# Patient Record
Sex: Female | Born: 1970
Health system: Southern US, Community
[De-identification: ages and names within clinical notes are randomized; demographics above are authoritative.]

## PROBLEM LIST (undated history)

## (undated) DIAGNOSIS — O1490 Unspecified pre-eclampsia, unspecified trimester: Secondary | ICD-10-CM

## (undated) DIAGNOSIS — M26609 Unspecified temporomandibular joint disorder, unspecified side: Secondary | ICD-10-CM

## (undated) DIAGNOSIS — R030 Elevated blood-pressure reading, without diagnosis of hypertension: Secondary | ICD-10-CM

## (undated) DIAGNOSIS — G43109 Migraine with aura, not intractable, without status migrainosus: Secondary | ICD-10-CM

## (undated) DIAGNOSIS — K219 Gastro-esophageal reflux disease without esophagitis: Secondary | ICD-10-CM

## (undated) HISTORY — DX: Migraine with aura, not intractable, without status migrainosus: G43.109

## (undated) HISTORY — DX: Elevated blood-pressure reading, without diagnosis of hypertension: R03.0

## (undated) HISTORY — DX: Unspecified pre-eclampsia, unspecified trimester: O14.90

## (undated) HISTORY — DX: Gastro-esophageal reflux disease without esophagitis: K21.9

## (undated) HISTORY — DX: Unspecified temporomandibular joint disorder, unspecified side: M26.609

---

## 1997-12-14 ENCOUNTER — Emergency Department (HOSPITAL_COMMUNITY): Admission: EM | Admit: 1997-12-14 | Discharge: 1997-12-14 | Payer: Self-pay | Admitting: Emergency Medicine

## 2000-04-21 ENCOUNTER — Other Ambulatory Visit: Admission: RE | Admit: 2000-04-21 | Discharge: 2000-04-21 | Payer: Self-pay | Admitting: Obstetrics and Gynecology

## 2001-07-04 ENCOUNTER — Other Ambulatory Visit: Admission: RE | Admit: 2001-07-04 | Discharge: 2001-07-04 | Payer: Self-pay | Admitting: Obstetrics and Gynecology

## 2002-07-10 ENCOUNTER — Other Ambulatory Visit: Admission: RE | Admit: 2002-07-10 | Discharge: 2002-07-10 | Payer: Self-pay | Admitting: Obstetrics and Gynecology

## 2003-06-19 ENCOUNTER — Other Ambulatory Visit: Admission: RE | Admit: 2003-06-19 | Discharge: 2003-06-19 | Payer: Self-pay | Admitting: *Deleted

## 2003-06-19 ENCOUNTER — Other Ambulatory Visit: Admission: RE | Admit: 2003-06-19 | Discharge: 2003-06-19 | Payer: Self-pay | Admitting: Obstetrics and Gynecology

## 2004-01-09 ENCOUNTER — Inpatient Hospital Stay (HOSPITAL_COMMUNITY): Admission: AD | Admit: 2004-01-09 | Discharge: 2004-01-12 | Payer: Self-pay | Admitting: Obstetrics and Gynecology

## 2004-01-13 ENCOUNTER — Encounter: Admission: RE | Admit: 2004-01-13 | Discharge: 2004-02-12 | Payer: Self-pay | Admitting: Obstetrics and Gynecology

## 2004-02-13 ENCOUNTER — Encounter: Admission: RE | Admit: 2004-02-13 | Discharge: 2004-03-14 | Payer: Self-pay | Admitting: Obstetrics and Gynecology

## 2004-03-24 ENCOUNTER — Emergency Department (HOSPITAL_COMMUNITY): Admission: EM | Admit: 2004-03-24 | Discharge: 2004-03-24 | Payer: Self-pay | Admitting: Family Medicine

## 2004-07-01 ENCOUNTER — Ambulatory Visit: Payer: Self-pay | Admitting: Family Medicine

## 2004-07-02 ENCOUNTER — Other Ambulatory Visit: Admission: RE | Admit: 2004-07-02 | Discharge: 2004-07-02 | Payer: Self-pay | Admitting: Obstetrics and Gynecology

## 2005-02-12 ENCOUNTER — Ambulatory Visit (HOSPITAL_COMMUNITY): Admission: AD | Admit: 2005-02-12 | Discharge: 2005-02-12 | Payer: Self-pay | Admitting: Obstetrics and Gynecology

## 2006-03-01 IMAGING — US US OB COMP LESS 14 WK
1 series · 14 of 28 positions shown · non-contrast
Comparison: none

CLINICAL DATA: :  10 weeks pregnant with vaginal bleeding.  
 OBSTETRICAL ULTRASOUND <14 WKS AND TRANSVAGINAL OB US:
TECHNIQUE: Both transabdominal and transvaginal ultrasound examinations were performed for complete evaluation of the gestation as well as the maternal uterus, adnexal regions, and pelvic cul-de-sac.

[Series 1: us ob comp less 14 wk · 0.33mm/px · 14 of 35 slices shown]
[im 2/35]
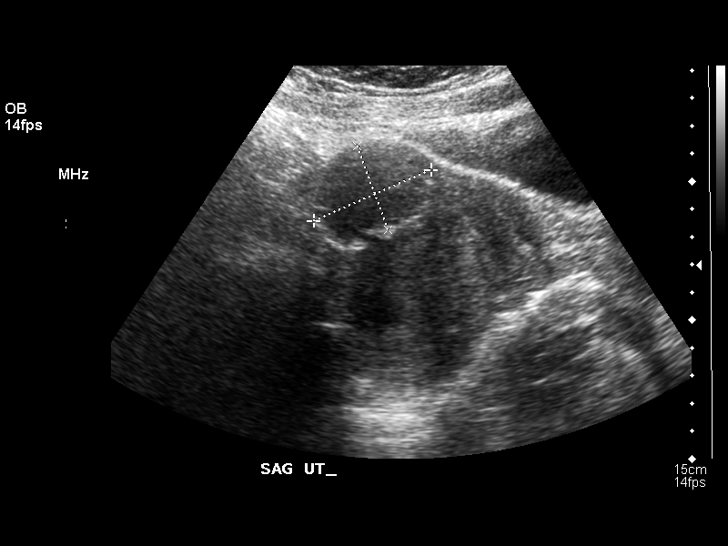
[im 4/35]
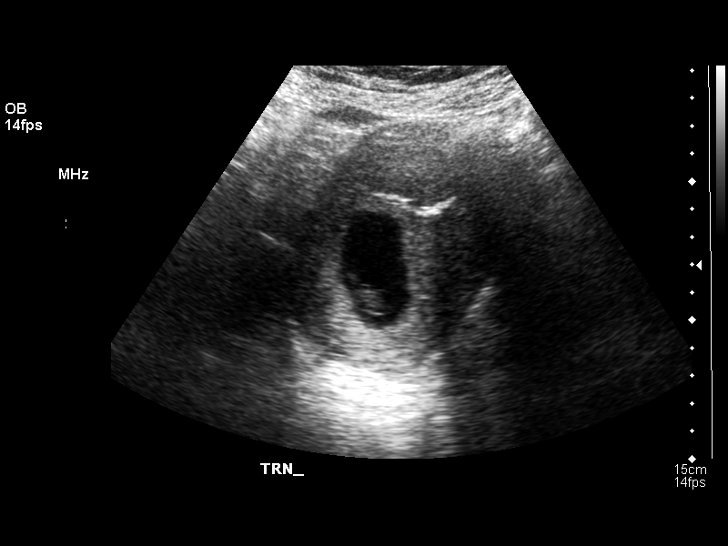
[im 7/35]
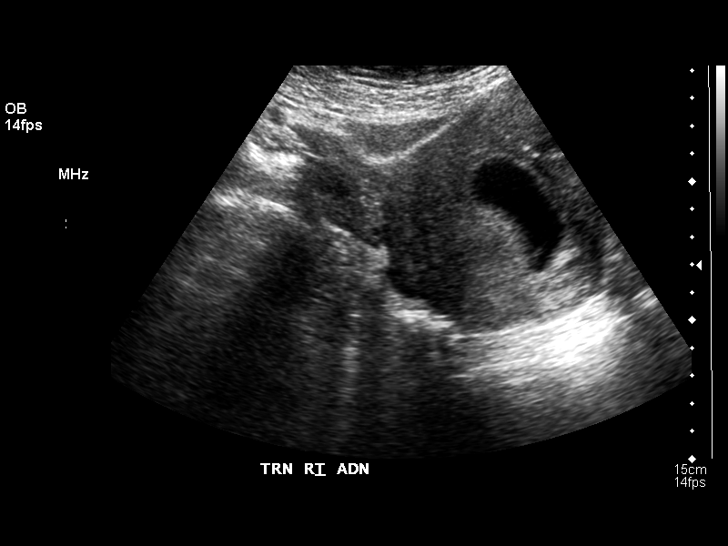
[im 9/35]
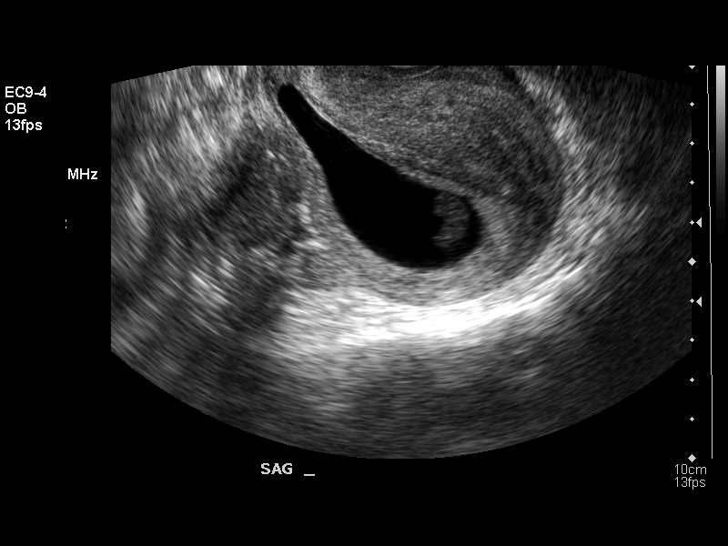
[im 12/35]
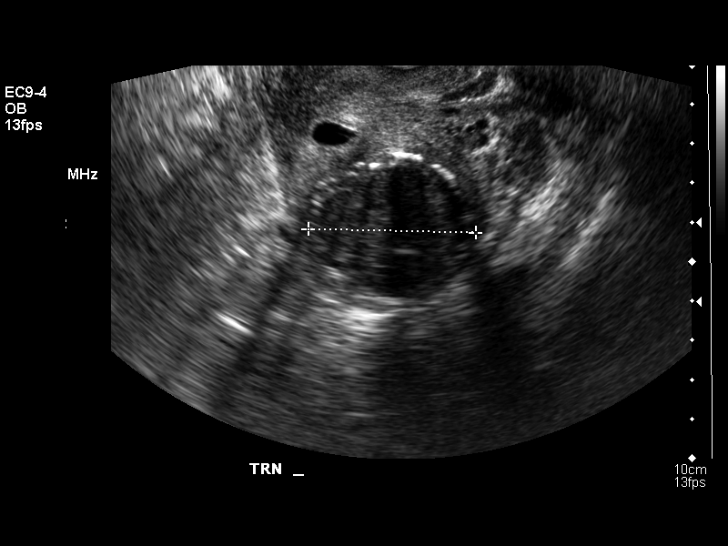
[im 14/35]
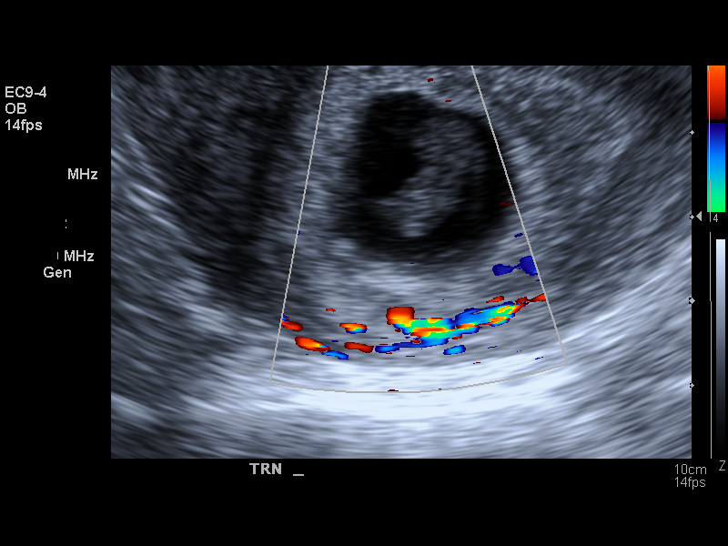
[im 17/35]
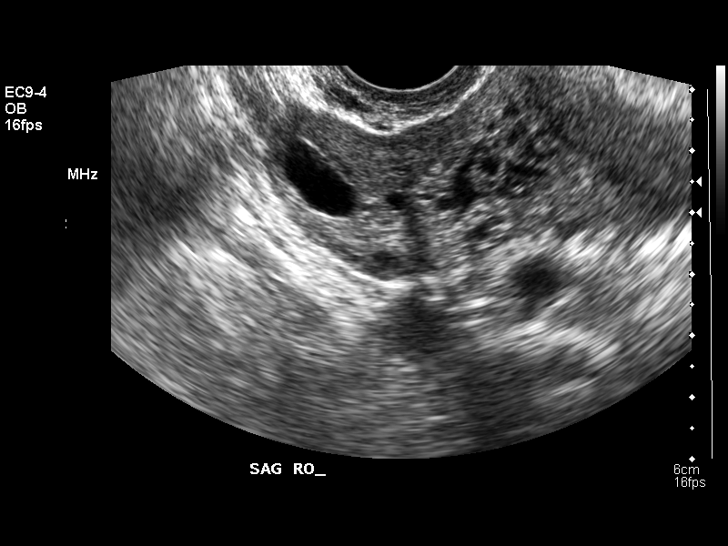
[im 19/35]
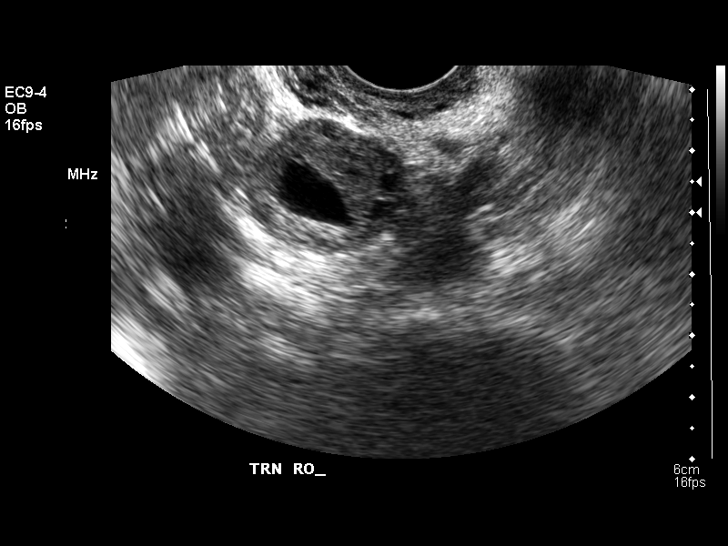
[im 22/35]
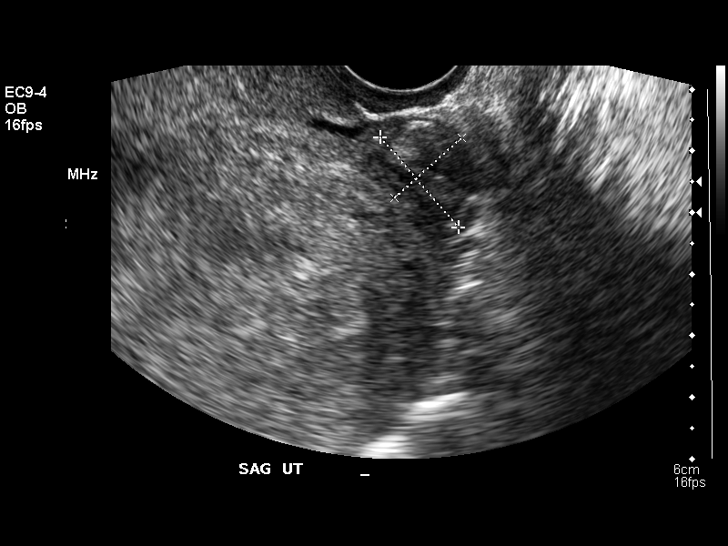
[im 24/35]
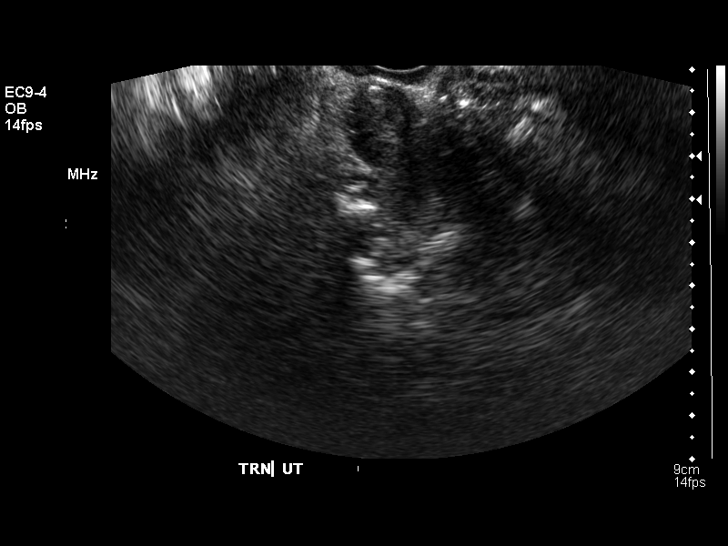
[im 27/35]
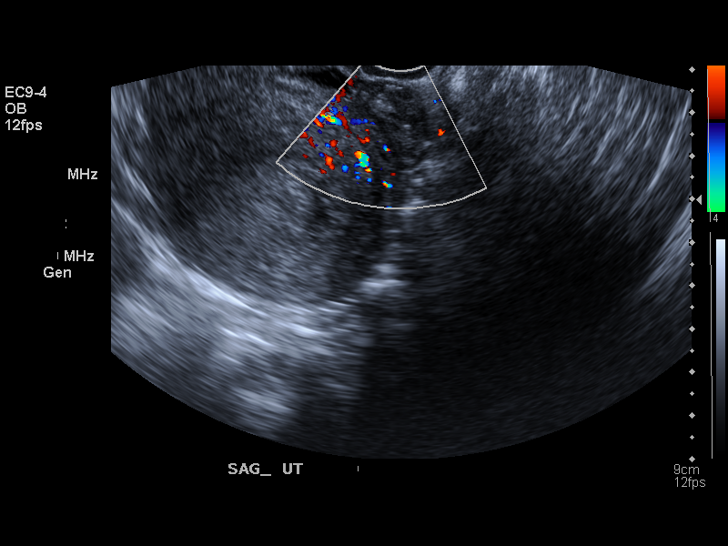
[im 29/35]
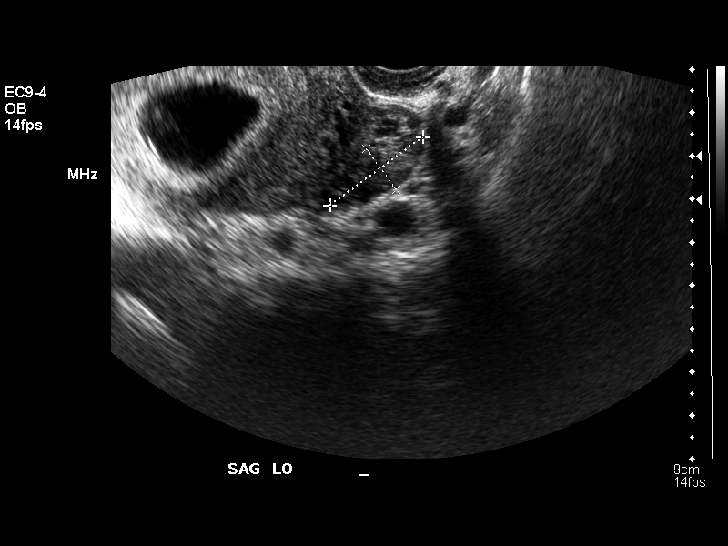
[im 32/35]
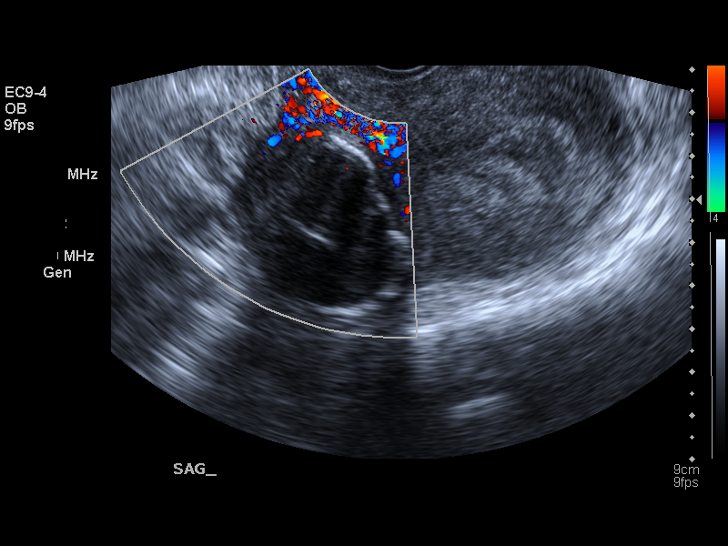
[im 35/35]
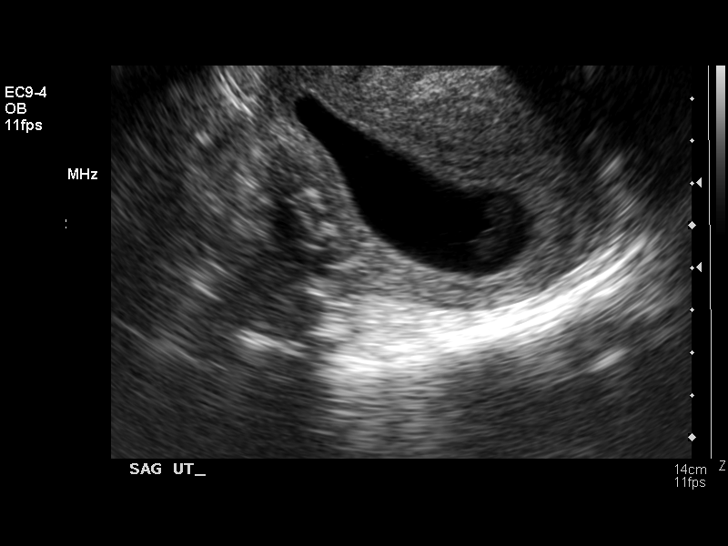

[14 of 28 positions shown; findings below may reference images not displayed]

FINDINGS: An intrauterine gestational sac is present with embryo.  By measurements of the crown rump length the estimated gestational age is 7 weeks 5 days.  However, no fetal cardiac activity is seen consistent with fetal demise.  The ovaries are normal.  Anterior uterine fibroid is noted measuring 4.4 x 3.5 x 4.3 cm with posterior fundal pedunculated fibroid of 1.9 x 1.5 x 1.8 cm.
IMPRESSION: 1.  Fetal demise.  Estimated gestational age 7 weeks 5 days.
 2.  Two uterine fibroids.

## 2006-04-20 ENCOUNTER — Inpatient Hospital Stay (HOSPITAL_COMMUNITY): Admission: AD | Admit: 2006-04-20 | Discharge: 2006-04-20 | Payer: Self-pay | Admitting: Obstetrics and Gynecology

## 2006-04-22 ENCOUNTER — Inpatient Hospital Stay (HOSPITAL_COMMUNITY): Admission: AD | Admit: 2006-04-22 | Discharge: 2006-04-22 | Payer: Self-pay | Admitting: Obstetrics and Gynecology

## 2006-04-23 ENCOUNTER — Inpatient Hospital Stay (HOSPITAL_COMMUNITY): Admission: AD | Admit: 2006-04-23 | Discharge: 2006-04-26 | Payer: Self-pay | Admitting: Obstetrics and Gynecology

## 2006-10-24 ENCOUNTER — Encounter: Payer: Self-pay | Admitting: Family Medicine

## 2006-10-24 DIAGNOSIS — K219 Gastro-esophageal reflux disease without esophagitis: Secondary | ICD-10-CM | POA: Insufficient documentation

## 2006-10-25 ENCOUNTER — Ambulatory Visit: Payer: Self-pay | Admitting: Family Medicine

## 2006-10-25 DIAGNOSIS — M26609 Unspecified temporomandibular joint disorder, unspecified side: Secondary | ICD-10-CM | POA: Insufficient documentation

## 2007-05-01 ENCOUNTER — Ambulatory Visit: Payer: Self-pay | Admitting: Family Medicine

## 2007-06-12 ENCOUNTER — Ambulatory Visit: Payer: Self-pay | Admitting: Family Medicine

## 2007-07-31 ENCOUNTER — Encounter: Payer: Self-pay | Admitting: Family Medicine

## 2007-11-22 ENCOUNTER — Ambulatory Visit: Payer: Self-pay | Admitting: Family Medicine

## 2007-12-11 ENCOUNTER — Ambulatory Visit: Payer: Self-pay | Admitting: Family Medicine

## 2008-09-27 ENCOUNTER — Emergency Department (HOSPITAL_COMMUNITY): Admission: EM | Admit: 2008-09-27 | Discharge: 2008-09-27 | Payer: Self-pay | Admitting: Emergency Medicine

## 2008-10-02 ENCOUNTER — Encounter: Payer: Self-pay | Admitting: Family Medicine

## 2008-10-03 ENCOUNTER — Ambulatory Visit: Payer: Self-pay | Admitting: Family Medicine

## 2008-10-03 DIAGNOSIS — R030 Elevated blood-pressure reading, without diagnosis of hypertension: Secondary | ICD-10-CM | POA: Insufficient documentation

## 2008-10-06 LAB — CONVERTED CEMR LAB
ALT: 17 units/L (ref 0–35)
AST: 16 units/L (ref 0–37)
Albumin: 3.5 g/dL (ref 3.5–5.2)
Alkaline Phosphatase: 46 units/L (ref 39–117)
BUN: 9 mg/dL (ref 6–23)
Basophils Absolute: 0 10*3/uL (ref 0.0–0.1)
Basophils Relative: 0.7 % (ref 0.0–3.0)
Bilirubin, Direct: 0 mg/dL (ref 0.0–0.3)
CO2: 29 meq/L (ref 19–32)
Calcium: 9 mg/dL (ref 8.4–10.5)
Chloride: 106 meq/L (ref 96–112)
Cholesterol: 179 mg/dL (ref 0–200)
Creatinine, Ser: 0.6 mg/dL (ref 0.4–1.2)
Eosinophils Absolute: 0.1 10*3/uL (ref 0.0–0.7)
Eosinophils Relative: 1.3 % (ref 0.0–5.0)
GFR calc non Af Amer: 118.41 mL/min (ref >60)
Glucose, Bld: 86 mg/dL (ref 70–99)
HCT: 38.6 % (ref 36.0–46.0)
HDL: 33.2 mg/dL — ABNORMAL LOW (ref >39.00)
Hemoglobin: 12.8 g/dL (ref 12.0–15.0)
LDL Cholesterol: 131 mg/dL — ABNORMAL HIGH (ref 0–99)
Lymphocytes Relative: 26.9 % (ref 12.0–46.0)
Lymphs Abs: 1.5 10*3/uL (ref 0.7–4.0)
MCHC: 33 g/dL (ref 30.0–36.0)
MCV: 86.6 fL (ref 78.0–100.0)
Monocytes Absolute: 0.6 10*3/uL (ref 0.1–1.0)
Monocytes Relative: 10.6 % (ref 3.0–12.0)
Neutro Abs: 3.2 10*3/uL (ref 1.4–7.7)
Neutrophils Relative %: 60.5 % (ref 43.0–77.0)
Phosphorus: 3.8 mg/dL (ref 2.3–4.6)
Platelets: 379 10*3/uL (ref 150.0–400.0)
Potassium: 4 meq/L (ref 3.5–5.1)
RBC: 4.46 M/uL (ref 3.87–5.11)
RDW: 11.6 % (ref 11.5–14.6)
Sodium: 140 meq/L (ref 135–145)
TSH: 0.79 microintl units/mL (ref 0.35–5.50)
Total Bilirubin: 0.7 mg/dL (ref 0.3–1.2)
Total CHOL/HDL Ratio: 5
Total Protein: 6.7 g/dL (ref 6.0–8.3)
Triglycerides: 73 mg/dL (ref 0.0–149.0)
VLDL: 14.6 mg/dL (ref 0.0–40.0)
WBC: 5.4 10*3/uL (ref 4.5–10.5)

## 2009-01-15 ENCOUNTER — Telehealth: Payer: Self-pay | Admitting: Family Medicine

## 2009-02-09 ENCOUNTER — Telehealth: Payer: Self-pay | Admitting: Family Medicine

## 2009-02-11 ENCOUNTER — Ambulatory Visit: Payer: Self-pay | Admitting: Family Medicine

## 2009-02-11 DIAGNOSIS — G43109 Migraine with aura, not intractable, without status migrainosus: Secondary | ICD-10-CM | POA: Insufficient documentation

## 2009-03-12 ENCOUNTER — Ambulatory Visit: Payer: Self-pay | Admitting: Family Medicine

## 2009-12-16 ENCOUNTER — Encounter
Admission: RE | Admit: 2009-12-16 | Discharge: 2010-02-09 | Payer: Self-pay | Source: Home / Self Care | Attending: Obstetrics and Gynecology | Admitting: Obstetrics and Gynecology

## 2010-02-09 NOTE — Consult Note (Signed)
Summary: [facility]/Consultation Report w/Path Report/[facility]/Consultation Report w/Path Tyna Jaksch PA-C   Imported By: Mickle Asper 08/02/2007 12:56:21  _____________________________________________________________________  External Attachment:    Type:   Image     Comment:   External Document

## 2010-02-09 NOTE — Assessment & Plan Note (Signed)
Summary: COUGH,CONGESTION,ST/CLE   Vital Signs:  Patient Profile:   40 Years Old Female Height:     67 inches (170.18 cm) Weight:      171.75 pounds (78.07 kg) Temp:     98.0 degrees F (36.67 degrees C) oral Pulse rate:   68 / minute Pulse rhythm:   regular BP sitting:   100 / 80  (left arm) Cuff size:   regular  Vitals Entered By: Silas Sacramento (December 11, 2007 10:51 AM)                 Chief Complaint:  cough/congestion.  History of Present Illness: Here due to URI--onset since 11/8--cough and resp virus, fever.  Cough never went away, now has nasal congestion too. 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  NAD Ears:     TMs retracted with increased fluid Nose:     no mucosal edema, no airflow obstruction, and mucosal erythema.  sinuses +,- Mouth:     no exudates and pharyngeal erythema.   Lungs:     freq moist harsh cough, no crackles and no wheezes.   Cervical Nodes:     shotty tonsilar nodes Psych:     normally interactive.      Impression & Recommendations:  Problem # 1:  BRONCHITIS-ACUTE (ICD-466.0) Assessment: New continue comfort care measures: increase po fluids, rest, tylenol or IBP as needed will start on zpac willl use tessalon daytine and hycodan at bedtime as needed cough see back 7-10d if not improved  Her updated medication list for this problem includes:    Zithromax Z-pak 250 Mg Tabs (Azithromycin) ..... Use as directed by mouth    Tessalon Perles 100 Mg Caps (Benzonatate) .Marland Kitchen... 1 or 2  every 8 hrs as needed cough   Complete Medication List: 1)  Protonix 20 Mg Tbec (Pantoprazole sodium) .... Take by mouth once daily as directed 2)  Ortho Micronor 0.35 Mg  Tabs (Norethindrone (contraceptive)) .... Once daily 3)  Multivitamins Tabs (Multiple vitamin) .... Once daily 4)  Zithromax Z-pak 250 Mg Tabs (Azithromycin) .... Use as directed by mouth 5)  Tessalon Perles 100 Mg Caps (Benzonatate) .Marland Kitchen.. 1 or 2  every 8 hrs as needed cough 6)  Hycodan  .Marland Kitchen.. 1 tsp at bedtime as needed cough by mouth    Prescriptions: HYCODAN 1 tsp at bedtime as needed cough by mouth  #177ml x 0   Entered and Authorized by:   Gildardo Griffes FNP   Signed by:   Gildardo Griffes FNP on 12/11/2007   Method used:   Print then Give to Patient   RxID:   [id number] TESSALON PERLES 100 MG CAPS (BENZONATATE) 1 or 2  every 8 hrs as needed cough  #30 x 1   Entered and Authorized by:   Gildardo Griffes FNP   Signed by:   Gildardo Griffes FNP on 12/11/2007   Method used:   Print then Give to Patient   RxID:   [id number] ZITHROMAX Z-PAK 250 MG TABS (AZITHROMYCIN) use as directed by mouth  #1 x 0   Entered and Authorized by:   Gildardo Griffes FNP   Signed by:   Mcarthur Rossetti  Tyrelle Raczka FNP on 12/11/2007   Method used:   Print then Give to Patient   RxID:   [id number]  ]

## 2010-02-09 NOTE — Assessment & Plan Note (Signed)
Summary: JAW PAIN/JAW LOCKS UP/HEA   Vital Signs:  Patient Profile:   40 Years Old Female Height:     67 inches (170.18 cm) Weight:      169.50 pounds Temp:     97.6 degrees F oral Pulse rate:   84 / minute Pulse rhythm:   regular BP sitting:   112 / 70  (left arm) Cuff size:   regular  Vitals Entered By: Delilah Shan (October 25, 2006 9:01 AM)                 Chief Complaint:  ? TMJ problems.  History of Present Illness: Every few years some jaw clicking with eating Over last week she has had jaw popping, painless, occ doesn't open never stuck open pain infront of ear muscle pain, across jaw and across nose takes 400 mg , ice to help  no tooth grinding  out of protonix for GERD, requests refill  Current Allergies (reviewed today): No known allergies   Past Medical History:    Reviewed history from 10/24/2006 and no changes required:       GERD   Family History:    Reviewed history from 10/24/2006 and no changes required:       Father: 2 CVA's (1st at 37 yo)  DM II       Mother: HTN       Siblings:        Grandparents:  PGM - CVA                                PGP - DM                                MGM, died BCA                                M. Aunt - [place]                                M. Uncle - Colon [place]    Review of Systems      See HPI   Physical Exam  General:     Well-developed,well-nourished,in no acute distress; alert,appropriate and cooperative throughout examination Eyes:     No corneal or conjunctival inflammation noted. EOMI. Perrla. Funduscopic exam benign, without hemorrhages, exudates or papilledema. Vision grossly normal. Ears:     External ear exam shows no significant lesions or deformities.  Otoscopic examination reveals clear canals, tympanic membranes are intact bilaterally without bulging, retraction, inflammation or discharge. Hearing is grossly normal bilaterally. Nose:     External nasal examination shows no  deformity or inflammation. Nasal mucosa are pink and moist without lesions or exudates. Mouth:     good dentition, no gingival abnormalities, no dental plaque, and pharynx pink and moist.  No dental caries, abcess Right TNJ joint tender , clicking, no locking Lungs:     Normal respiratory effort, chest expands symmetrically. Lungs are clear to auscultation, no crackles or wheezes. Heart:     Normal rate and regular rhythm. S1 and S2 normal without gallop, murmur, click, rub or other extra sounds.    Impression & Recommendations:  Problem # 1:  TEMPOROMANDIBULAR JOINT DISORDER (ICD-524.60) Info given.  Use  NSAID, ice, exercises, avoid chewing.  If muscle spasm returns call for possible muscle relaxant to use. If not improving call for appt with oral surgeon.    Problem # 2:  GERD (ICD-530.81) Refilled, but instructed for further refills needs to see primary MD who she has not seen since 2006. Her updated medication list for this problem includes:    Protonix 20 Mg Tbec (Pantoprazole sodium) ..... Every other day   Complete Medication List: 1)  Protonix 20 Mg Tbec (Pantoprazole sodium) .... Every other day 2)  Ortho Micronor 0.35 Mg Tabs (Norethindrone (contraceptive)) .... Once daily 3)  Multivitamins Tabs (Multiple vitamin) .... Once daily   Patient Instructions: 1)  if symptoms continue call for referral to oral surgeon    Prescriptions: PROTONIX 20 MG  TBEC (PANTOPRAZOLE SODIUM) every other day  #30 x 2   Entered and Authorized by:   Kerby Nora MD   Signed by:   Kerby Nora MD on 10/25/2006   Method used:   [facility] ...       [id number]       [address]       [place], [place]  [address]       Ph: [phone] or [phone]       Fax: [phone]   RxID:   [id number]  ] Current Allergies (reviewed today): No known allergies  Current Medications (including changes made in today's visit):  PROTONIX 20 MG  TBEC (PANTOPRAZOLE  SODIUM) every other day ORTHO MICRONOR 0.35 MG  TABS (NORETHINDRONE (CONTRACEPTIVE)) once daily MULTIVITAMINS   TABS (MULTIPLE VITAMIN) once daily

## 2010-02-09 NOTE — Assessment & Plan Note (Signed)
Summary: REFILL PROTONIX/CLE   Vital Signs:  Patient Profile:   40 Years Old Female Height:     67 inches (170.18 cm) Weight:      173.75 pounds Temp:     97.9 degrees F oral Pulse rate:   68 / minute BP sitting:   94 / 60  (left arm)  Vitals Entered By: Lowella Petties (June 12, 2007 1:58 PM)                 Chief Complaint:  refill protonix.  History of Present Illness: takes protonix- which works well most of the time -- takes it every other day reflux is stress related   on stressful days takes pepto too discomfort - burp and bloat and acid in throat- no abd pain  bowel habits are ok   used to take it daily for 3-4 days at a time   has never had an ulcer or bleeding has never had EGD  some family hx of colon cancer- aunt and uncle no Esoph cancer   is thinking about getting pregnant again  in past prilosec and zantac did not work nexium and protinix do   mole on L arm- is changing color     Current Allergies: No known allergies   Past Medical History:    GERD    pre eclampsia   Family History:    Reviewed history from 10/24/2006 and no changes required:       Father: 2 CVA's (1st at 81 yo)  DM II       Mother: HTN       Siblings:        Grandparents:  PGM - CVA                                PGP - DM                                MGM, died BCA                                M. Aunt - Colon [place]                                M. 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  CV      Denies chest pain or discomfort and palpitations.  Resp      Denies cough, shortness of  breath, and wheezing.  GI      Denies bloody stools, constipation, dark tarry stools, and diarrhea.  Derm      Denies changes  in color of skin and rash.  Psych      Denies anxiety and depression.      mood is good   Heme      Denies bleeding.   Physical Exam  General:     overweight but generally well appearing  Head:     normocephalic, atraumatic, and no abnormalities observed.   Eyes:     vision grossly intact, pupils equal, pupils round, and pupils reactive to light.  no conjunctival pallor, injection or icterus  Mouth:     pharynx pink and moist.   Neck:     No deformities, masses, or tenderness noted. Lungs:     Normal respiratory effort, chest expands symmetrically. Lungs are clear to auscultation, no crackles or wheezes. Heart:     Normal rate and regular rhythm. S1 and S2 normal without gallop, murmur, click, rub or other extra sounds. Abdomen:     Bowel sounds positive,abdomen soft and non-tender without masses, organomegaly or hernias noted. Extremities:     No clubbing, cyanosis, edema, or deformity noted with normal full range of motion of all joints.   Skin:     Intact without suspicious lesions or rashes Cervical Nodes:     No lymphadenopathy noted Psych:     normal affect, talkative and pleasant     Impression & Recommendations:  Problem # 1:  NEOPLASM OF UNCERTAIN BEHAVIOR OF SKIN (ICD-238.2) Assessment: New mole on L lower arm- consistent with dermatofibroma but with chanigng color  will refer to derm for further eval  Orders: Dermatology Referral (Derma)   Problem # 2:  GERD (ICD-530.81) Assessment: Unchanged ongoing- well controlled with protonix will continue to take once daily as needed- or every other day if symtpoms are not active if symptoms worsen -- would consider checking Hpylori or EGD Her updated medication list for this problem includes:    Protonix 20 Mg Tbec (Pantoprazole sodium) .Marland Kitchen... Take by mouth once daily as  directed   Complete Medication List: 1)  Protonix 20 Mg Tbec (Pantoprazole sodium) .... Take by mouth once daily as directed 2)  Ortho Micronor 0.35 Mg Tabs (Norethindrone (contraceptive)) .... Once daily 3)  Multivitamins Tabs (Multiple vitamin) .... Once daily   Patient Instructions: 1)  watch diet for acidic or spicy foods , caffine and eating late  2)  continue the protonix as you have been taking it  3)  if symptoms re occur- or become more persistant please update me   Prescriptions: PROTONIX 20 MG  TBEC (PANTOPRAZOLE SODIUM) take by mouth once daily as directed  #30 x 11   Entered and Authorized by:   Judith Part MD   Signed by:   Judith Part MD on 06/12/2007   Method used:   Print then Give to Patient   RxID:   [id number]  ]

## 2010-02-09 NOTE — Assessment & Plan Note (Signed)
Summary: BP AND VISION ISSUES   Vital Signs:  Patient profile:   40 year old female Weight:      163 pounds Temp:     98.1 degrees F oral Pulse rate:   76 / minute Pulse rhythm:   regular BP sitting:   112 / 68  (left arm) Cuff size:   regular  Vitals Entered By: Lowella Petties CMA (February 11, 2009 11:40 AM) CC: BP and vision problems.   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  eye strain is common spot in her vision started - like broken glass in both eyes -- in one spot only  lasted 20 minutes  went on lunch break -- her bp cuff did not work  so she went to [employer] down the street -- they took it 158/108 -- high for her  spot was gone then-- and just a mild headache remained   headache started as pressure over one eye and then moved fully to one side/ no throbbing  then took some ibuprofen  headache persisted - very mild 1/10 no sensitivity to sound or light - but is a Klindt nauseated yesterday   has had only one migraine in past   lots of stress for 3 weeks drinks one cup of coffee per day  not sleeping as well as usual (also sick 40 year old)  does not drink enough water   bp at work 120s-140/ 80s to 90 - high about 50% of the time  has been feeling fine   last opthy exam a while ago  lasik surgery - 10 years ago - really likes it   is on OC -- in debate over wanting another baby  Allergies: No Known Drug Allergies  Family History: Father: 2 CVA's (1st at 12 yo)  DM II Mother: HTN Siblings:  Grandparents:  PGM - CVA-- at 68                          PGP - DM                          MGM, died BCA                          M. Aunt - [place]                          M. Uncle - Colon [place] no migraines in family   Review of Systems General:  Denies fatigue, fever, loss of appetite, and malaise. Eyes:  Denies blurring and eye pain. ENT:  Denies ringing in ears, sinus  pressure, and sore throat. CV:  Denies chest pain or discomfort, palpitations, and shortness of breath with exertion. Resp:  Denies cough and wheezing. GI:  Denies abdominal pain, nausea, and vomiting. GU:  Denies dysuria and urinary frequency. MS:  Denies joint pain and joint swelling. Derm:  Denies lesion(s), poor wound healing, and rash. Neuro:  Complains of headaches; denies difficulty with concentration, disturbances in coordination, numbness, sensation of room spinning, tingling, tremors, and weakness. Psych:  Denies anxiety and depression. Endo:  Denies cold intolerance, excessive thirst, excessive urination, and heat intolerance. Heme:  Denies abnormal bruising and bleeding.  Physical Exam  General:  Well-developed,well-nourished,in no acute distress; alert,appropriate and cooperative throughout examination Head:  normocephalic, atraumatic, and no abnormalities observed.  no sinus or temporal tenderness Eyes:  vision grossly intact, pupils equal, pupils round, and pupils reactive to light.  fundi grossly wnl no conjunctival pallor, injection or icterus  Ears:  R ear normal and L ear normal.   Nose:  no nasal discharge.   Mouth:  pharynx pink and moist.   Neck:  supple with full rom and no masses or thyromegally, no JVD or carotid bruit  Chest Wall:  No deformities, masses, or tenderness noted. Lungs:  Normal respiratory effort, chest expands symmetrically. Lungs are clear to auscultation, no crackles or wheezes. Heart:  Normal rate and regular rhythm. S1 and S2 normal without gallop, murmur, click, rub or other extra sounds. Abdomen:  Bowel sounds positive,abdomen soft and non-tender without masses, organomegaly or hernias noted. Msk:  No deformity or scoliosis noted of thoracic or lumbar spine.  no acute joint changes Pulses:  R and L carotid,radial,femoral,dorsalis pedis and posterior tibial pulses are full and equal bilaterally Extremities:  No clubbing, cyanosis, edema, or  deformity noted with normal full range of motion of all joints.   Neurologic:  alert & oriented X3, cranial nerves II-XII intact, strength normal in all extremities, sensation intact to light touch, gait normal, DTRs symmetrical and normal, toes down bilaterally on Babinski, and Romberg negative.   Skin:  Intact without suspicious lesions or rashes Cervical Nodes:  No lymphadenopathy noted Psych:  normal affect, talkative and pleasant    Impression & Recommendations:  Problem # 1:  ELEVATED BLOOD PRESSURE (ICD-796.2) Assessment New mild and intermittent disc lifestyle change for HTN- avoid sodium, inc water intake and less caffiene trial of low dose atenolol for this and also headache Her updated medication list for this problem includes:    Atenolol 25 Mg Tabs (Atenolol) .Marland Kitchen... 1 by mouth once daily  Problem # 2:  MIGRAINE WITH AURA (ICD-346.00) Assessment: New will try atenolol for headache proph and also elevated bp  adv to update if side eff or problems  disc habits to avoid headaches  f/u 1 mo update if worse or other symptoms  Her updated medication list for this problem includes:    Atenolol 25 Mg Tabs (Atenolol) .Marland Kitchen... 1 by mouth once daily  Complete Medication List: 1)  Loestrin 24 Fe 1-20 Mg-mcg Tabs (Norethin ace-eth estrad-fe) .... Take one by mouth daily as directed 2)  Multivitamins Tabs (Multiple vitamin) .... Once daily 3)  Nexium 40 Mg Cpdr (Esomeprazole magnesium) .Marland Kitchen.. 1 by mouth once daily in am 4)  Atenolol 25 Mg Tabs (Atenolol) .Marland Kitchen.. 1 by mouth once daily  Patient Instructions: 1)  start atenolol 25 mg 1 pill daily  2)  keep check on blood pressure  3)  if too low (90/50 or if dizzy or any other side effects - update me) 4)  watch salt in diet  5)  avoid caffine/ drink lots of water and get regular sleep) 6)  follow up with me 1 month  7)  if you have more migraines - let me know  Prescriptions: ATENOLOL 25 MG TABS (ATENOLOL) 1 by mouth once daily  #30 x  11   Entered and Authorized by:   Judith Part MD   Signed by:   Judith Part MD on 02/11/2009   Method used:   [facility]        [facility]  [address] [phone][facility]ail)  [address]       [place], [place]  [address]       Ph: [phone] or [phone]       Fax: [phone]   RxID:   [id number]   Prior Medications (reviewed today): LOESTRIN 24 FE 1-20 MG-MCG TABS (NORETHIN ACE-ETH ESTRAD-FE) take one by mouth daily as directed MULTIVITAMINS   TABS (MULTIPLE VITAMIN) once daily NEXIUM 40 MG CPDR (ESOMEPRAZOLE MAGNESIUM) 1 by mouth once daily in am ATENOLOL 25 MG TABS (ATENOLOL) 1 by mouth once daily Current Allergies: No known allergies

## 2010-02-09 NOTE — Assessment & Plan Note (Signed)
Summary: BP elevated/ lb   Vital Signs:  Patient profile:   40 year old female Weight:      163 pounds BMI:     25.62 Temp:     97.6 degrees F oral Pulse rate:   80 / minute Pulse rhythm:   regular BP sitting:   92 / 60  (left arm) Cuff size:   regular  Vitals Entered By: Lowella Petties CMA (October 03, 2008 8:20 AM) CC: BP has been elevated   History of Present Illness: here for elevation in blood pressure was in ER on 9/18  about 3 weeks ago felt weird at work- thought bp was low it was 130/100- high for her  stayed that way for 7 hours -- kept checking it  felt like her scalp was tingly and eyes were tracking behind where they should be   then last saturday- at work - started feeling light headed and nauseated  bp was 160/110- really high  then felt really unsteady  went to the hospital and seen in ER by the time she got there-- 1-2 hours was down to 120/50  no irregular rhythm on EKGs and no palpitations or pulse changes   wt is down 8 l b  no idea what could be causing it  mother had similar problems in the past  father had cva at 61 and gm with stroke at 39  no smoking or drinking  OC is not new -- since last year  no HTN in other offices (did have pre-eclampsia with preg)   no change in diet  some salt  some minimal exercise- 20 min on elliptical at work  at work- drinks 2 c coffee once daily (12 hour shift) no soda  tea instead of coffee if not at work   no more stress than usual   protonix no longer preferred   has had intentional wt loss lately- back to pre- preg wt  happy about that no wt loss drugs or decongestants   Allergies: No Known Drug Allergies  Past History:  Past Medical History: Last updated: 06/12/2007 GERD pre eclampsia  Family History: Last updated: 10/03/2008 Father: 2 CVA's (1st at 62 yo)  DM II Mother: HTN Siblings:  Grandparents:  PGM - CVA-- at 19                          PGP - DM  MGM, died BCA                          M. Aunt - [place]                          M. Uncle - [place]  Social History: Last updated: 10/24/2006 Never Smoked Alcohol use-yes, occasionally (light) Drug use-no Marital Status: Married Children: 1, 6 months old Occupation: Works for [employer], night shift     English Degree ([place])    EMT course  Risk Factors: Smoking Status: never (10/24/2006)  Family History: Father: 2 CVA's (1st at 65 yo)  DM II Mother: HTN Siblings:  Grandparents:  PGM - CVA-- at 2                          PGP - DM  MGM, died BCA                          M. Aunt - [place]                          M. Uncle - [place]  Review of Systems General:  Denies chills, fatigue, fever, loss of appetite, and malaise. Eyes:  Denies blurring and eye pain. CV:  Denies chest pain or discomfort, palpitations, and shortness of breath with exertion. Resp:  Denies cough and wheezing. GI:  Denies abdominal pain, bloody stools, change in bowel habits, and indigestion; ? needs to change protonix prilosec does not work. GU:  Denies dysuria and urinary frequency. MS:  Denies joint pain and cramps. Derm:  Denies poor wound healing and rash. Neuro:  Denies difficulty with concentration, inability to speak, memory loss, numbness, poor balance, tingling, and weakness; very occ mild headache . Psych:  Denies anxiety, depression, panic attacks, and sense of great danger. Endo:  Denies cold intolerance, excessive thirst, excessive urination, and heat intolerance. Heme:  Denies abnormal bruising and bleeding.  Physical Exam  General:  Well-developed,well-nourished,in no acute distress; alert,appropriate and cooperative throughout examination Head:  normocephalic, atraumatic, and no abnormalities observed.  no temporal tenderness  Eyes:  vision grossly intact, pupils equal, pupils round, and pupils reactive to light.   fundi grossly wnl no nystagmus Ears:   R ear normal and L ear normal.   Nose:  no nasal discharge.   Mouth:  pharynx pink and moist.   Neck:  supple with full rom and no masses or thyromegally, no JVD or carotid bruit  Chest Wall:  No deformities, masses, or tenderness noted. Lungs:  Normal respiratory effort, chest expands symmetrically. Lungs are clear to auscultation, no crackles or wheezes. Heart:  Normal rate and regular rhythm. S1 and S2 normal without gallop, murmur, click, rub or other extra sounds. Abdomen:  Bowel sounds positive,abdomen soft and non-tender without masses, organomegaly or hernias noted. no renal bruits  Msk:  No deformity or scoliosis noted of thoracic or lumbar spine.  no acute joint changes  Pulses:  R and L carotid,radial,femoral,dorsalis pedis and posterior tibial pulses are full and equal bilaterally Extremities:  No clubbing, cyanosis, edema, or deformity noted with normal full range of motion of all joints.   Neurologic:  alert & oriented X3, cranial nerves II-XII intact, strength normal in all extremities, sensation intact to light touch, gait normal, DTRs symmetrical and normal, toes down bilaterally on Babinski, and Romberg negative.   Skin:  Intact without suspicious lesions or rashes Cervical Nodes:  No lymphadenopathy noted Inguinal Nodes:  No significant adenopathy Psych:  normal affect, talkative and pleasant - not seemingly anx or stressed at all   Impression & Recommendations:  Problem # 1:  ELEVATED BLOOD PRESSURE (ICD-796.2) Assessment New intermittent bp elevation - without particular triggers  she is on OC - but no change in that  disc lifestyle / stress/ caf intake / family hx  labs today incl lipids  adv pt to quit caffiene and also check bp daily at work- keep a log  f/u 4-6 weeks with log  adv to call in meantime if she has any more episodes Orders: Venipuncture (16109) TLB-Lipid Panel (80061-LIPID) TLB-Renal Function Panel (80069-RENAL) TLB-Hepatic/Liver Function Pnl  (80076-HEPATIC) TLB-CBC Platelet - w/Differential (85025-CBCD) TLB-TSH (Thyroid Stimulating Hormone) (84443-TSH) EKG w/ Interpretation (93000)  Complete Medication List:  1)  Protonix 20 Mg Tbec (Pantoprazole sodium) .... Take by mouth once daily as directed 2)  Loestrin 24 Fe 1-20 Mg-mcg Tabs (Norethin ace-eth estrad-fe) .... Take one by mouth daily as directed 3)  Multivitamins Tabs (Multiple vitamin) .... Once daily  Patient Instructions: 1)  gradually cut out caffine  2)  keep exercising  3)  lab today 4)  check your blood pressure on work days and keep a log  5)  follow up with me 4-6 weeks with your list of blood pressures  6)  update me if symptoms return  Prior Medications (reviewed today): PROTONIX 20 MG  TBEC (PANTOPRAZOLE SODIUM) take by mouth once daily as directed LOESTRIN 24 FE 1-20 MG-MCG TABS (NORETHIN ACE-ETH ESTRAD-FE) take one by mouth daily as directed MULTIVITAMINS   TABS (MULTIPLE VITAMIN) once daily Current Allergies: No known allergies    EKG  Procedure date:  10/03/2008  Findings:      NSR with rate of 68 and no acute changes

## 2010-02-09 NOTE — Assessment & Plan Note (Signed)
Summary: COUGH/CLE   Vital Signs:  Patient Profile:   40 Years Old Female Height:     67 inches (170.18 cm) Weight:      173 pounds Temp:     97.8 degrees F oral Pulse rate:   83 / minute BP sitting:   104 / 76  (right arm) Cuff size:   regular  Vitals Entered By: Cooper Render (May 01, 2007 11:54 AM)                 Chief Complaint:  URI sx, cough, and taking tylenol cold & sinus.  History of Present Illness: Here for URI sx--onset x 5d ago with "cold " sx, worse past 2d, moved into chest--productive of green mucus.  Taking tylenol cold and sinus--helping.  cough wakens at night.      Current Allergies (reviewed today): No known allergies      Review of Systems      See HPI   Physical Exam  General:     alert, well-developed, well-nourished, and well-hydrated.  NAD Ears:     TMs retracted with increased fluid Nose:     no airflow obstruction, mucosal erythema, and mucosal edema.   Mouth:     no exudates and pharyngeal erythema.   Lungs:     moist cough, no crackles and no wheezes.   Neurologic:     alert & oriented X3 and gait normal.   Skin:     turgor normal, color normal, and no rashes.   Psych:     normally interactive and good eye contact.      Impression & Recommendations:  Problem # 1:  URI (ICD-465.9) Assessment: New continue comfort care measures: increase po fluids, rest, tylenol or IBP as needed will use hycodan at bedtime as needed cough gave Rx for zpac--will fill if worsens over next 3-5d and will call in if fills see back in 7-10d if not improved Her updated medication list for this problem includes:    Hycodan 5-1.5 Mg/46ml Syrp (Hydrocodone-homatropine) .Marland Kitchen... 1 tsp at bedtime as needed cough, may repeat in 4-6h as needed   Complete Medication List: 1)  Protonix 20 Mg Tbec (Pantoprazole sodium) .... Every other day 2)  Ortho Micronor 0.35 Mg Tabs (Norethindrone (contraceptive)) .... Once daily 3)  Multivitamins Tabs (Multiple  vitamin) .... Once daily 4)  Hycodan 5-1.5 Mg/56ml Syrp (Hydrocodone-homatropine) .Marland Kitchen.. 1 tsp at bedtime as needed cough, may repeat in 4-6h as needed 5)  Zithromax Z-pak 250 Mg Tabs (Azithromycin) .... Use as directed     Prescriptions: ZITHROMAX Z-PAK 250 MG  TABS (AZITHROMYCIN) use as directed  #1 x 0   Entered and Authorized by:   Gildardo Griffes FNP   Signed by:   Gildardo Griffes FNP on 05/01/2007   Method used:   Print then Give to Patient   RxID:   [id number] HYCODAN 5-1.5 MG/5ML  SYRP (HYDROCODONE-HOMATROPINE) 1 tsp at bedtime as needed cough, may repeat in 4-6h as needed  #175ml x 0   Entered and Authorized by:   Gildardo Griffes FNP   Signed by:   Gildardo Griffes FNP on 05/01/2007   Method used:   Print then Give to Patient   RxID:   [id number]  ] Prior Medications (reviewed today): PROTONIX 20 MG  TBEC (PANTOPRAZOLE SODIUM) every other day ORTHO MICRONOR 0.35 MG  TABS (NORETHINDRONE (CONTRACEPTIVE)) once daily MULTIVITAMINS   TABS (MULTIPLE VITAMIN) once daily HYCODAN 5-1.5 MG/5ML  SYRP (HYDROCODONE-HOMATROPINE) 1  tsp at bedtime as needed cough, may repeat in 4-6h as needed ZITHROMAX Z-PAK 250 MG  TABS (AZITHROMYCIN) use as directed Current Allergies (reviewed today): No known allergies

## 2010-02-09 NOTE — Letter (Signed)
Summary: Out of Work  [facility]  [address] [place], [place] [address]   Phone: [phone]  Fax: [phone]    November 22, 2007   Employee:  Joann Davis    To Whom It May Concern:   For Medical reasons, please excuse the above named employee from work for the following dates:  Start:   11/22/2007  End:   11/24/2007 if she is feeling better   If you need additional information, please feel free to contact our office.         Sincerely,    Judith Part MD

## 2010-02-09 NOTE — Progress Notes (Signed)
Summary: Elevated BP and vision change  Phone Note Call from Patient Call back at [phone] cell   Caller: Patient Call For: Judith Part MD Summary of Call: Pt's BP was 150/108 at 2:50pm today. Pt had vision change, like she was looking thru a piece of glass that was broken. Effected both eyes. Lasted for 15 minutes. Now has resolved and pt can see normally. Pt has mild h/a pain level now is 2. [facility] [place] [phone]. Please advise.  Initial call taken by: Lewanda Rife LPN,  February 09, 2009 2:57 PM  Follow-up for Phone Call        needs to f/u with first availible  if severe headache after hours - seek care at ER  Follow-up by: Judith Part MD,  February 09, 2009 3:30 PM  Additional Follow-up for Phone Call Additional follow up Details #1::        Left message on pt's voice mail advising her to call for appt, seek care at ER if after hours. Additional Follow-up by: Lowella Petties CMA,  February 09, 2009 3:46 PM

## 2010-02-09 NOTE — Assessment & Plan Note (Signed)
Summary: acute/flu like symptoms/cmt   Vital Signs:  Patient Profile:   40 Years Old Female Height:     67 inches (170.18 cm) Weight:      172 pounds BMI:     27.04 Temp:     97.7 degrees F oral Pulse rate:   76 / minute Pulse rhythm:   regular BP sitting:   116 / 78  (left arm) Cuff size:   regular  Vitals Entered By: Liane Comber (November 22, 2007 11:59 AM)                 Chief Complaint:  flu like sx.  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) then monday started running a fever - chills and aches -- has been that way ever since  skin is burning on back and shoulders - but no rash  temp has been no higher than 101.5  takes tylenol for   took some old cough syrup - with hydrocodone   some mild runny nose  irritated throat- raw but not sore  had headache for several days- better now   cough is not productive  some nausea - no v/d    Current Allergies (reviewed today): No known allergies   Past Medical History:    Reviewed history from 06/12/2007 and no changes required:       GERD       pre eclampsia   Family History:    Reviewed history from 10/24/2006 and no changes required:       Father: 2 CVA's (1st at 50 yo)  DM II       Mother: HTN       Siblings:        Grandparents:  PGM - CVA                                PGP - DM                                MGM, died BCA                                M. Aunt - Colon [place]                                M. Uncle - [place]  Social History:    Reviewed history from 10/24/2006 and no changes required:       Never Smoked       Alcohol use-yes, occasionally (light)       Drug use-no       Marital Status: Married       Children: 1, 68 months old       Occupation: Works for [employer], night shift           English Degree ([place])    EMT course           Review of  Systems  General      Complains of chills, fatigue, fever, and loss of appetite.  Eyes      Denies blurring, discharge, and eye pain.  ENT  Complains of hoarseness, nasal congestion, sinus pressure, and sore throat.  CV      Denies chest pain or discomfort and palpitations.  Resp      Complains of cough and sputum productive.      Denies wheezing.  GI      Denies abdominal pain and change in bowel habits.  Derm      Denies lesion(s) and rash.   Physical Exam  General:     overweight but generally well appearing hoarse voice - and somewhat fatigued  Head:     Normocephalic and atraumatic without obvious abnormalities. no sinus tenderness  Eyes:     vision grossly intact, pupils equal, pupils round, and pupils reactive to light.   Ears:     R ear normal and L ear normal.   Nose:     nares are congested and injected  Mouth:     pharynx pink and moist, no erythema, and no exudates.   Neck:     supple with full rom and no masses or thyromegally, no JVD or carotid bruit  Chest Wall:     No deformities, masses, or tenderness noted. Lungs:     harsh bs at bases with no crackles/rales or wheeze  Heart:     Normal rate and regular rhythm. S1 and S2 normal without gallop, murmur, click, rub or other extra sounds. Skin:     Intact without suspicious lesions or rashes Cervical Nodes:     No lymphadenopathy noted Psych:     normal affect, talkative and pleasant     Impression & Recommendations:  Problem # 1:  URI (ICD-465.9) Assessment: New viral with cough and improving fever sympt care and rest discussed- tylenol, fluids, hycodan for cough update if not imp in 1 week off work tom update if cough becomes productive  Complete Medication List: 1)  Protonix 20 Mg Tbec (Pantoprazole sodium) .... Take by mouth once daily as directed 2)  Ortho Micronor 0.35 Mg Tabs (Norethindrone (contraceptive)) .... Once daily 3)  Multivitamins Tabs (Multiple vitamin) .... Once  daily 4)  Hycodan Syrup  .Marland Kitchen.. 1 teaspoon up to every 6 hours as needed cough   Patient Instructions: 1)  try to get lots of rest  2)  use hycodan for cough with caution- can make you sleepy  3)  tylenol for fever  4)  update me if fever goes up or cough becomes productive or if you do not improve in next week  5)  stay home tommorrow - and rest    Prescriptions: HYCODAN SYRUP 1 teaspoon up to every 6 hours as needed cough  #120cc x 0   Entered and Authorized by:   Judith Part MD   Signed by:   Judith Part MD on 11/22/2007   Method used:   Print then Give to Patient   RxID:   [id number]  ]

## 2010-02-09 NOTE — Assessment & Plan Note (Signed)
Summary: ONE MONTH FOLLOW UP / LFW   Vital Signs:  Patient profile:   40 year old female Height:      66.75 inches Weight:      163.50 pounds BMI:     25.89 Temp:     97.6 degrees F oral Pulse rate:   72 / minute Pulse rhythm:   regular BP sitting:   94 / 60  (left arm) Cuff size:   regular  Vitals Entered By: Lewanda Rife LPN (March 12, 1608 9:08 AM)  History of Present Illness: here for f/u of headache/ migraine and high bp readings  no more incidents- with migraines  no headaches at all   no trouble with beta blocker  bp is excellent  few times was in one teens /80s   no problems   is trying to be good with diet  had to take a flexeril last night -- 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 Aunt - Colon [place]                          M. Uncle - Colon [place] no migraines in family   Social History: Last updated: 10/24/2006 Never Smoked Alcohol use-yes, occasionally (light) Drug use-no Marital Status: Married Children: 1, 6 months old Occupation: Works for [employer], night shift     English Degree ([place])    EMT course  Risk Factors: Smoking Status: never (10/24/2006)  Review of Systems General:  Denies chills, fatigue, fever, and loss of appetite. Eyes:  Denies blurring, double vision, eye irritation, eye pain, vision loss-1 eye, and vision loss-both eyes. ENT:  Denies sinus pressure and sore throat. CV:  Denies chest pain or discomfort, palpitations, and shortness of breath with exertion. Resp:  Denies cough and wheezing. GI:  Denies abdominal pain, bloody stools, change in  bowel habits, and nausea. MS:  Denies joint pain and joint swelling. Derm:  Denies lesion(s), poor wound healing, and rash. Neuro:  Denies headaches, numbness, tingling, visual disturbances, and weakness. Psych:  Denies anxiety and depression. Endo:  Denies excessive thirst and excessive urination.  Physical Exam  General:  Well-developed,well-nourished,in no acute distress; alert,appropriate and cooperative throughout examination Head:  normocephalic, atraumatic, and no abnormalities observed.   Eyes:  vision grossly intact, pupils equal, pupils round, and pupils reactive to light.   fundi grossly wnl  Mouth:  pharynx pink and moist.   Neck:  supple with full rom and no masses or thyromegally, no JVD or carotid bruit  Chest Wall:  No deformities, masses, or tenderness noted. Lungs:  Normal respiratory effort, chest expands symmetrically. Lungs are clear to auscultation, no crackles or wheezes. Heart:  Normal  rate and regular rhythm. S1 and S2 normal without gallop, murmur, click, rub or other extra sounds. Pulses:  R and L carotid,radial,femoral,dorsalis pedis and posterior tibial pulses are full and equal bilaterally Extremities:  No clubbing, cyanosis, edema, or deformity noted with normal full range of motion of all joints.   Neurologic:  cranial nerves II-XII intact, sensation intact to light touch, gait normal, and DTRs symmetrical and normal.   Skin:  Intact without suspicious lesions or rashes Cervical Nodes:  No lymphadenopathy noted Inguinal Nodes:  No significant adenopathy Psych:  normal affect, talkative and pleasant    Impression & Recommendations:  Problem # 1:  MIGRAINE WITH AURA (ICD-346.00) Assessment Improved resolved with low dose atenolol and lifestyle change will continue that - and update if they return Her updated medication list for this problem includes:    Atenolol 25 Mg Tabs (Atenolol) .Marland Kitchen... 1 by mouth once daily    Mobic 7.5 Mg Tabs (Meloxicam) .Marland Kitchen...  Take one tablet as needed  Problem # 2:  ELEVATED BLOOD PRESSURE (ICD-796.2) Assessment: Improved  resolved with low dose atenolol -- will continue that and watch for s/s hypotension or low pulse  she will continue to monitor at work  Her updated medication list for this problem includes:    Atenolol 25 Mg Tabs (Atenolol) .Marland Kitchen... 1 by mouth once daily  BP today: 94/60 Prior BP: 112/68 (02/11/2009)  Labs Reviewed: Creat: 0.6 (10/03/2008) Chol: 179 (10/03/2008)   HDL: 33.20 (10/03/2008)   LDL: 131 (10/03/2008)   TG: 73.0 (10/03/2008)  Instructed in low sodium diet (DASH Handout) and behavior modification.    Complete Medication List: 1)  Loestrin 24 Fe 1-20 Mg-mcg Tabs (Norethin ace-eth estrad-fe) .... Take one by mouth daily as directed 2)  Multivitamins Tabs (Multiple vitamin) .... Once daily 3)  Nexium 40 Mg Cpdr (Esomeprazole magnesium) .Marland Kitchen.. 1 by mouth once daily in am 4)  Atenolol 25 Mg Tabs (Atenolol) .Marland Kitchen.. 1 by mouth once daily 5)  Flexeril 10 Mg Tabs (Cyclobenzaprine hcl) .... Take one tablet as needed 6)  Mobic 7.5 Mg Tabs (Meloxicam) .... Take one tablet as needed  Patient Instructions: 1)  no change in medicine  2)  let me know if headaches return or any other problems  3)  check bp intermittently   Current Allergies (reviewed today): No known allergies

## 2010-02-09 NOTE — Progress Notes (Signed)
Summary: Nexium  Phone Note Call from Patient Call back at Home Phone [phone]   Caller: Patient Call For: Judith Part MD Summary of Call: pt checked with her insurance and would like a 90day rx for Nexium which is covered thru her insurance, [facility]. Initial call taken by: Mervin Hack CMA Duncan Dull),  January 15, 2009 4:00 PM  Follow-up for Phone Call        px written on EMR for call in for nexium  Follow-up by: Judith Part MD,  January 15, 2009 4:08 PM  Additional Follow-up for Phone Call Additional follow up Details #1::        [facility] [address]. Additional Follow-up by: Lowella Petties CMA,  January 16, 2009 9:46 AM    New/Updated Medications: NEXIUM 40 MG CPDR (ESOMEPRAZOLE MAGNESIUM) 1 by mouth once daily in am Prescriptions: NEXIUM 40 MG CPDR (ESOMEPRAZOLE MAGNESIUM) 1 by mouth once daily in am  #90 x 3   Entered and Authorized by:   Judith Part MD   Signed by:   Lowella Petties CMA on 01/16/2009   Method used:   Telephoned to ...         RxID:   [id number]

## 2010-02-17 ENCOUNTER — Inpatient Hospital Stay (HOSPITAL_COMMUNITY)
Admission: AD | Admit: 2010-02-17 | Discharge: 2010-02-19 | DRG: 774 | Disposition: A | Discharge status: Home / Self Care | Payer: 59 | Source: Ambulatory Visit | Attending: Obstetrics and Gynecology | Admitting: Obstetrics and Gynecology

## 2010-02-17 ENCOUNTER — Other Ambulatory Visit: Payer: Self-pay | Admitting: Obstetrics and Gynecology

## 2010-02-17 DIAGNOSIS — O99814 Abnormal glucose complicating childbirth: Secondary | ICD-10-CM | POA: Diagnosis present

## 2010-02-17 DIAGNOSIS — O09529 Supervision of elderly multigravida, unspecified trimester: Secondary | ICD-10-CM | POA: Diagnosis present

## 2010-02-17 DIAGNOSIS — O1002 Pre-existing essential hypertension complicating childbirth: Principal | ICD-10-CM | POA: Diagnosis present

## 2010-02-17 LAB — CBC
HCT: 44.4 % (ref 36.0–46.0)
Hemoglobin: 15.2 g/dL — ABNORMAL HIGH (ref 12.0–15.0)
MCH: 30.5 pg (ref 26.0–34.0)
MCHC: 34.2 g/dL (ref 30.0–36.0)
MCV: 89.2 fL (ref 78.0–100.0)
Platelets: 248 10*3/uL (ref 150–400)
RBC: 4.98 MIL/uL (ref 3.87–5.11)
RDW: 13 % (ref 11.5–15.5)
WBC: 9.4 10*3/uL (ref 4.0–10.5)

## 2010-02-17 LAB — COMPREHENSIVE METABOLIC PANEL
ALT: 11 U/L (ref 0–35)
AST: 20 U/L (ref 0–37)
Albumin: 2.5 g/dL — ABNORMAL LOW (ref 3.5–5.2)
Alkaline Phosphatase: 148 U/L — ABNORMAL HIGH (ref 39–117)
BUN: 7 mg/dL (ref 6–23)
CO2: 19 mEq/L (ref 19–32)
Calcium: 8.5 mg/dL (ref 8.4–10.5)
Chloride: 109 mEq/L (ref 96–112)
Creatinine, Ser: 0.37 mg/dL — ABNORMAL LOW (ref 0.4–1.2)
GFR calc Af Amer: >60 mL/min (ref >60)
GFR calc non Af Amer: >60 mL/min (ref >60)
Glucose, Bld: 92 mg/dL (ref 70–99)
Potassium: 3.8 mEq/L (ref 3.5–5.1)
Sodium: 136 mEq/L (ref 135–145)
Total Bilirubin: 0.5 mg/dL (ref 0.3–1.2)
Total Protein: 5.4 g/dL — ABNORMAL LOW (ref 6.0–8.3)

## 2010-02-17 LAB — RPR: RPR Ser Ql: NONREACTIVE

## 2010-02-17 LAB — LACTATE DEHYDROGENASE: LDH: 117 U/L (ref 94–250)

## 2010-02-17 LAB — URINALYSIS, DIPSTICK ONLY
Bilirubin Urine: NEGATIVE
Hgb urine dipstick: NEGATIVE
Ketones, ur: NEGATIVE mg/dL
Leukocytes, UA: NEGATIVE
Nitrite: NEGATIVE
Protein, ur: NEGATIVE mg/dL
Specific Gravity, Urine: 1.02 (ref 1.005–1.030)
Urine Glucose, Fasting: NEGATIVE mg/dL
Urobilinogen, UA: 0.2 mg/dL (ref 0.0–1.0)
pH: 6 (ref 5.0–8.0)

## 2010-02-17 LAB — URIC ACID: Uric Acid, Serum: 4.2 mg/dL (ref 2.4–7.0)

## 2010-02-18 LAB — CBC
HCT: 36.7 % (ref 36.0–46.0)
Hemoglobin: 12.2 g/dL (ref 12.0–15.0)
MCH: 30 pg (ref 26.0–34.0)
MCHC: 33.2 g/dL (ref 30.0–36.0)
MCV: 90.4 fL (ref 78.0–100.0)
Platelets: 210 10*3/uL (ref 150–400)
RBC: 4.06 MIL/uL (ref 3.87–5.11)
RDW: 13.3 % (ref 11.5–15.5)
WBC: 11.1 10*3/uL — ABNORMAL HIGH (ref 4.0–10.5)

## 2010-02-18 LAB — GLUCOSE, CAPILLARY: Glucose-Capillary: 83 mg/dL (ref 70–99)

## 2010-02-19 LAB — GLUCOSE, CAPILLARY: Glucose-Capillary: 93 mg/dL (ref 70–99)

## 2010-03-01 ENCOUNTER — Encounter: Payer: Self-pay | Admitting: Family Medicine

## 2010-03-01 ENCOUNTER — Ambulatory Visit (INDEPENDENT_AMBULATORY_CARE_PROVIDER_SITE_OTHER): Payer: 59 | Admitting: Family Medicine

## 2010-03-01 DIAGNOSIS — J069 Acute upper respiratory infection, unspecified: Secondary | ICD-10-CM | POA: Insufficient documentation

## 2010-03-09 NOTE — Assessment & Plan Note (Signed)
Summary: ST/CLE   [facility]   Vital Signs:  Patient profile:   40 year old female Weight:      179.25 pounds BMI:     28.39 Temp:     98.0 degrees F oral Pulse rate:   72 / minute Pulse rhythm:   regular BP sitting:   128 / 80  (left arm) Cuff size:   regular  Vitals Entered By: Sydell Axon LPN (March 01, 2010 9:32 AM) CC: Productive cough-green/yellow and sore throat   History of Present Illness: saturday started loosing voice and then sore throat  then the prod cough - some yellow/ green last night started with congestion   no fever   throat was bad last night - a [place] better this am - then worse again this is worst symptom no strep in the house   both kids have coughs mom had laryngitis   had baby 12 days ago and is nursing - not getting any sleep  Allergies: No Known Drug Allergies  Past History:  Past Medical History: Last updated: 06/12/2007 GERD pre eclampsia  Family History: Last updated: 02/11/2009 Father: 2 CVA's (1st at 16 yo)  DM II Mother: HTN Siblings:  Grandparents:  PGM - CVA-- at 76                          PGP - DM                          MGM, died BCA                          M. Aunt - Colon CA                          M. Uncle - Colon CA no migraines in family   Social History: Last updated: 10/24/2006 Never Smoked Alcohol use-yes, occasionally (light) Drug use-no Marital Status: Married Children: 1, 6 months old Occupation: Works for [employer], night shift     English Degree ([place])    EMT course  Risk Factors: Smoking Status: never (10/24/2006)  Review of Systems General:  Complains of malaise; denies loss of appetite. Eyes:  Denies blurring, discharge, and eye irritation. ENT:  Complains of nasal congestion, postnasal drainage, sinus pressure, and sore throat; denies earache. CV:  Denies chest pain or discomfort, palpitations, and shortness of breath with exertion. Resp:  Complains of cough; denies shortness of breath  and wheezing. GI:  Denies abdominal pain, change in bowel habits, nausea, and vomiting. Derm:  Denies rash.  Physical Exam  General:  fatigued appearing / congested female cries when talking about symptoms and home situation Head:  normocephalic, atraumatic, and no abnormalities observed.  no sinus tenderness Eyes:  vision grossly intact, pupils equal, pupils round, pupils reactive to light, and no injection.   Ears:  R ear normal and L ear normal.   Nose:  nares are injected and congested bilaterally  Mouth:  post throat injection , no exudate no swelling  Neck:  supple with full rom and no masses or thyromegally, no JVD or carotid bruit  Lungs:  Normal respiratory effort, chest expands symmetrically. Lungs are clear to auscultation, no crackles or wheezes. Heart:  Normal rate and regular rhythm. S1 and S2 normal without gallop, murmur, click, rub or other extra sounds. Skin:  Intact without suspicious  lesions or rashes Cervical Nodes:  No lymphadenopathy noted Psych:  pt quite tearful when talking about illness in light of lack of sleep with new baby   Impression & Recommendations:  Problem # 1:  VIRAL URI (ICD-465.9) Assessment New  with sore throat that is becoming more severe and some sinus pain  will tx with amox (decided this after a call back later in day when pt stated her sore throat was much, much worse) claritin ok for rhinorrhea and plain guifenesin for congestion - as directed  pt advised to update me if symptoms worsen or do not improve  Her updated medication list for this problem includes:    Mobic 7.5 Mg Tabs (Meloxicam) .Marland Kitchen... Take one tablet as needed  Orders: Prescription Created Electronically [phone])  Complete Medication List: 1)  Multivitamins Tabs (Multiple vitamin) .... Once daily 2)  Nexium 40 Mg Cpdr (Esomeprazole magnesium) .Marland Kitchen.. 1 by mouth once daily in am 3)  Atenolol 25 Mg Tabs (Atenolol) .Marland Kitchen.. 1 by mouth once daily 4)  Flexeril 10 Mg Tabs  (Cyclobenzaprine hcl) .... Take one tablet as needed 5)  Mobic 7.5 Mg Tabs (Meloxicam) .... Take one tablet as needed 6)  Amoxicillin 250 Mg/75ml Susr (Amoxicillin) .... 2 teaspoons by mouth three times a day for 10 days  Patient Instructions: 1)  tylenol / rest as you can 2)  lots of fluids  3)  I will update you when I get more info on symptomatic care  4)  update me asap if fever or increased productive cough or worse in any way Prescriptions: AMOXICILLIN 250 MG/5ML SUSR (AMOXICILLIN) 2 teaspoons by mouth three times a day for 10 days  #10 days x 0   Entered and Authorized by:   Judith Part MD   Signed by:   Judith Part MD on 03/01/2010   Method used:   Electronically to        [facility]  [address] (retail)       [address]       [place], [place]  [address]       Ph: [phone] or [phone]       Fax: [phone]   RxID:   [id number] NEXIUM 40 MG CPDR (ESOMEPRAZOLE MAGNESIUM) 1 by mouth once daily in am  #90 x 3   Entered and Authorized by:   Judith Part MD   Signed by:   Judith Part MD on 03/01/2010   Method used:   Print then Give to Patient   RxID:   [id number]    Orders Added: 1)  Prescription Created Electronically [G8553] 2)  Est. Patient Level III [95188]    Current Allergies (reviewed today): No known allergies

## 2010-05-28 NOTE — H&P (Signed)
NAMESYMPHANI, Joann Davis                ACCOUNT NO.:  [id number]   MEDICAL RECORD NO.:  [id number]          PATIENT TYPE:  INP   LOCATION:  9164                          [facility]ILITY:  [facility]   PHYSICIAN:  Naima A. Dillard, M.D. DATE OF BIRTH:  08-Mar-1970   DATE OF ADMISSION:  01/09/2004  DATE OF DISCHARGE:                                HISTORY & PHYSICAL   HISTORY OF PRESENT ILLNESS:  This is a 40 year old gravida 1, para 0 at 21-  4/7 weeks who presents for evaluation of labor.  She reports leaking fluid  since about 4 a.m.  She was seen this morning and had a negative exam for  rupture, but was mildly contracting and cervix was 2- to 3-cm dilated.  She  came back later this afternoon and had a positive exam for rupture with  cervix of 3-4 cm, 95% effaced and -1 station with a vertex presentation,  positive Nitrazine and positive pooling.  She is therefore being admitted  for labor.  Pregnancy has been followed by the nurse midwife service and  remarkable for:  #1 - Unsure LMP, #2 - GERD, #3 - fibroids.   HISTORY OF CURRENT PREGNANCY:  The patient entered care at 6 weeks.  She had  an early ultrasound which confirmed dates.  Labs were normal.  Quad screen  was normal.  She had an ultrasound at 18 weeks which was within normal  limits and remarkable only for an anterior lower uterine segment fibroid at  2 cm away from the os.  She had some bronchitis in mid-pregnancy.  Glucola  was normal.  She did get a flu shot at work and her group B strep was  negative.   OBSTETRICAL HISTORY:  The patient is a primigravida.   MEDICAL HISTORY:  Medical history is remarkable for:  1.  Uterine fibroid.  2.  Childhood varicella.  3.  Hepatitis vaccine.  4.  History of anemia which she reports as being 2 episodes in the last 10      years.  5.  History of GERD for which she uses Zantac.   SURGICAL HISTORY:  Her surgical history is remarkable for:  1.  A broken arm at age 46.  2.  Wisdom teeth.  3.   LASIK surgery.   FAMILY HISTORY:  Family history is remarkable for a grandfather with MI,  mother with hypertension, father with diet-controlled diabetes, cousin with  seizures, grandmother with breast cancer and aunt and uncle with colon  cancers, father with a stroke, grandmother with stroke.   GENETIC HISTORY:  Genetic history is remarkable for patient's husband's  second cousin with Down's syndrome and a second cousin who is deaf, and a  first cousin who died of leukemia.   SOCIAL HISTORY:  The patient is married to  [employer], who is involved and  supportive.  She works in Doctor, hospital.  She is of the [employer].  She denies any alcohol, tobacco or drug use.   PRENATAL LABORATORIES:  Hemoglobin 13.1 and platelets 361,000.  Blood type O-  positive, antibody screen negative.  RPR  nonreactive.  Rubella immune.  Hepatitis negative.  HIV negative.  Pap test normal.  Gonorrhea negative.  Chlamydia negative.  Toxoplasmosis negative.  Group B strep negative.   OBJECTIVE DATA:  VITAL SIGNS:  Vital signs stable, afebrile.  HEENT:  Within normal limits.  NECK:  Thyroid normal, not enlarged.  CHEST:  Chest clear to auscultation.  HEART:  Heart rate regular rate and rhythm.  ABDOMEN:  Abdomen gravid, 38 cm, vertex to Leopold's.  Fetal heart rate 150.  PELVIC:  Cervical exam 3-4 cm, 95% effaced, -1 station with a vertex  presentation, positive Nitrazine, positive pooling, clear fluid.  EXTREMITIES:  Extremities within normal limits.   ASSESSMENT:  1.  Intrauterine pregnancy at 39-4/7 weeks.  2.  Early active labor.  3.  Spontaneous rupture of membranes.  4.  Group B streptococcus negative.   PLAN:  1.  Admit to birthing suite, Renaldo Reel. Emilee Hero, C.N.M. notified.  Dr. Pierre Bali.      Dillard will be notified.  2.  Routine C.N.M. orders.     Larey Brick   MLW/MEDQ  D:  01/09/2004  T:  01/09/2004  Job:  [id number]

## 2010-05-28 NOTE — H&P (Signed)
NAMEJA, PISTOLE                ACCOUNT NO.:  [id number]   MEDICAL RECORD NO.:  [id number]          PATIENT TYPE:  INP   LOCATION:  9162                          [facility]   PHYSICIAN:  Naima A. Dillard, M.D. DATE OF BIRTH:  Aug 26, 1970   DATE OF ADMISSION:  04/23/2006  DATE OF DISCHARGE:                              HISTORY & PHYSICAL   HISTORY OF PRESENT ILLNESS:  Ms. Joann Davis is a 40 year old gravida 3, para  1-0-1-1, who presents at 36-4/7 weeks, EDD May 16, 2006.  She presents  for induction of labor secondary to preeclampsia.  She returned a 24-  hour urine collection on April 22, 2006 with results showing total  protein of 325 mg per day.  She has been seen in maternity admissions on  Thursday, April 20, 2006 and April 22, 2006 for elevated blood  pressures.  She does not report any PIH symptoms, no headache, visual  changes or epigastric pain.  She does report positive fetal movement,  irregular contractions.  No abdominal cramping or pain.  No vaginal  bleeding, no rupture of membranes and no nausea or vomiting.  Her  pregnancy has been followed by the C.N.M. service at [facility] and is  remarkable for:  1. Advanced maternal age.  2. Increased Down syndrome risk on quad screen of 1 on 6.  The patient      declined amniocentesis.  3. First trimester spotting.  4. Fibroids.  5. Gastric reflux.  6. Group B strep negative.   This patient initiated prenatal care at the office of [facility] on September 28, 2005 at [redacted] weeks gestation.  EDC determined by early pregnancy  ultrasound at 7 weeks 1 day and confirmed with follow-up.  Her pregnancy  has been remarkable for gastric reflux.  She has taken Protonix 20 mg  p.o. daily with relief, and significant for elevated Down syndrome risk  on quad screen of 1 in 6.  She discussed these findings with Dr.  Stefano Gaul on December 07, 2005.  She declined amniocentesis at that time.  She had a 3-D ultrasound on December 22, 2005 showing a single  IUP at 19  weeks 2 days.  The cervix measured 3.56 cm at that time.  Fluid was  within normal limits.  There was normal anatomy noted and adjusted Down  syndrome risk was 1 in 12.  The patient was again offered amniocentesis  and continued to decline.  Her pregnancy has been otherwise  unremarkable.  She had an ultrasound for growth at 29 weeks which showed  estimated fetal weight at 1003 grams, at the 63rd percentile, and fluid  18.4 cm at 70 percentile.  She did have an ultrasound exam again at 32  and 36 weeks.  At 36 weeks and 2 days, on April 20, 2006, the patient  was seen at the office of [facility] for complaint of increased swelling.  She  was found at that time to have an elevated blood pressure and protein on  a clean catch urine specimen.  She was sent to maternity admissions for  further evaluation.  She completed a 24-hour urine which was returned on  Saturday, April 22, 2006.  The results of that 24-hour urine showed 325  mg of protein per day.  She is admitted today on April 23, 2006 for  induction of labor for preeclampsia.  Prenatal lab work on September 28, 2005 revealed hemoglobin and hematocrit of 13.4 and 42.0, platelets  399,000.  Blood type and Rh O+, antibody screen negative, VDRL  nonreactive, rubella immune, hepatitis B surface antigen negative, HIV  nonreactive.  CF testing negative.  The patient's quad screen results  showed Down syndrome risk of 1 in 6.  She declined amniocentesis.  At 28  weeks, 1-hour glucose challenge 132 and RPR nonreactive.  At 36 weeks,  culture of the vaginal tract is negative for group B strep.   ALLERGIES:  The patient has no known drug allergies.   OBSTETRIC HISTORY:  In 2005, the patient had a normal spontaneous  vaginal delivery at term with the birth of a 7 pound, 8 ounce female  infant named [name] with no complications.  In January of 2007, the  patient had a first trimester SAB with a D&C and no complications.  This  is her  third and current pregnancy.   MEDICAL HISTORY:  Significant for GERD.   SURGICAL HISTORY:  1. Wisdom teeth.  2. Lasix surgery.  3. She fractured her arm at age 40.   FAMILY HISTORY:  Maternal grandfather - MI.  The patient's mother - CAD  status post CABG.  Mother - chronic hypertension.  Mother (?) DVT.  Patient's father with a history of diabetes.  Patient's father and  paternal grandmother - CVA.  Maternal grandmother with a history of  breast cancer.   GENETIC HISTORY:  Father of the baby's second cousin with Down syndrome  and first cousin with leukemia.  The patient is 22 years old.  Quad  screen during this pregnancy shows an increased Down syndrome risk of  1  in 6.  Ultrasound exam at 19 weeks found adjusted Down syndrome risk 1  in 12.  The patient declined amniocentesis.   SOCIAL HISTORY:  Ms. Montone is a married Caucasian female.  She works as  a Nurse, adult.  Her husband, Lakiah Dhingra, is a Biomedical engineer.  He is involved and supportive.  They are cath catholic in  their faith.  She denies the use of tobacco, alcohol or illicit drugs.   REVIEW OF SYSTEMS:  As described above.  The patient is 36-4/[redacted] weeks  pregnant with preeclampsia, admitted to [facility]  for induction of labor.   PHYSICAL EXAMINATION:  VITAL SIGNS:  The patient is afebrile.  Blood  pressure 141/96.  HEENT:  Unremarkable.  HEART:  Regular rate and rhythm.  LUNGS:  Clear.  ABDOMEN:  Gravid in its contour.  Uterine fundus is noted to extend 37  cm above the level of the pubic symphysis.  Leopold's maneuver finds the  infant to be in a longitudinal lie, cephalic presentation, and the  estimated fetal weight is 6 pounds.  The baseline of the fetal heart  rate monitor is 140s with average long-term variability.  Reactivity is  present with no periodic changes.  The patient is contracting irregularly.  Digital exam of the cervix finds it to be 2-3 cm dilated,  50%  effaced, with the cephalic presenting part at a -2 station and  membranes intact.  EXTREMITIES:  1+ edema.  DTRs are  2+ and brisk with no clonus.  There is  no calf tenderness noted bilaterally.  Twenty-four hour urine results  from April 22, 2006 finds total volume 1250 over 24 hours.  Total  protein 325 mg per day.  Creatinine clearance 207 and urine creatinine  117.   ASSESSMENT:  1. Intrauterine pregnancy at 36-4/7 weeks.  2. Preeclampsia.   PLAN:  Admit per Dr. Jaymes Graff.  Begin magnesium sulfate, 4 grams  bolus, then 2 grams per hour.  Begin Pitocin per high dose protocol, PIH  labs.  Dr. Normand Sloop will come in and evaluate the patient.      Rica Koyanagi, C.N.M.      Naima A. Normand Sloop, M.D.  Electronically Signed    SDM/MEDQ  D:  04/23/2006  T:  04/23/2006  Job:  [id number]

## 2010-05-28 NOTE — Op Note (Signed)
[name], Joann Davis                ACCOUNT NO.:  [id number]   MEDICAL RECORD NO.:  [id number]          PATIENT TYPE:  AMB   LOCATION:  SDC                           [facility]   PHYSICIAN:  Hal Morales, M.D.DATE OF BIRTH:  Jan 06, 1971   DATE OF PROCEDURE:  02/13/2005  DATE OF DISCHARGE:                                 OPERATIVE REPORT   PREOPERATIVE DIAGNOSIS:  Incomplete spontaneous abortion.   POSTOPERATIVE DIAGNOSIS:  Incomplete spontaneous abortion.   OPERATION:  Suction dilatation and evacuation.   ANESTHESIA:  Monitored anesthesia care and local.   ESTIMATED BLOOD LOSS:  Approximately 400 mL of clot had been passed by the  patient prior to getting to the OR. Intraoperatively the blood loss was less  than 50 mL.   COMPLICATIONS:  None.   FINDINGS:  The uterus was enlarged to approximately 10-week size and there  was a moderate amount of products of conception.   PROCEDURE:  The patient was taken to the operating room after appropriate  identification and placed on the operating table. After placement of  equipment for monitored anesthesia care. She was placed in the lithotomy  position. The perineum and vagina were prepped with multiple layers of  Betadine and a red Robinson catheter used to empty the bladder. The perineum  was draped as a sterile field. A Graves speculum was placed in the vagina  and a single-tooth tenaculum placed on the anterior cervix. A paracervical  block was achieved with a total of 10 mL of 2% Xylocaine in the 5 at 7  o'clock positions. The cervix was already open to accommodate a #10 suction  curette. This was used to suction evacuate all quadrants of the uterus.  Sharp curette was used to ensure that all products of conception had been  removed. All instruments were removed from the vagina. A suture of 0 Vicryl  in a figure-of-eight fashion was used to achieve adequate hemostasis. The  patient was taken from the operating room to the  recovery room in  satisfactory condition having tolerated the procedure well with sponge and  instrument counts correct.   SPECIMENS TO PATHOLOGY:  Products of conceptions.      Hal Morales, M.D.  Electronically Signed     VPH/MEDQ  D:  02/13/2005  T:  02/13/2005  Job:  [id number]

## 2010-05-28 NOTE — H&P (Signed)
NAMEALAN, Joann Davis                ACCOUNT NO.:  [id number]   MEDICAL RECORD NO.:  [id number]          PATIENT TYPE:  INP   LOCATION:  9309                          [facility]   PHYSICIAN:  Naima A. Dillard, M.D. DATE OF BIRTH:  1970-06-03   DATE OF ADMISSION:  04/23/2006  DATE OF DISCHARGE:  04/26/2006                              HISTORY & PHYSICAL   ADMITTING DIAGNOSES:  1. Intrauterine pregnancy at 42 and 4/7 weeks.  2. Preeclampsia.   PROCEDURE:  Normal spontaneous vaginal delivery.   DISCHARGE DIAGNOSES:  1. Intrauterine pregnancy at 67 and 4/7 weeks delivered.  2. Preeclampsia.  3. Normal spontaneous vaginal delivery.   Joann Davis is a 40 year old, gravida 3, para 1-0-1-1 who presented at 23  and 4/7 weeks for induction of labor secondary to preeclampsia.  Her  labor was induced with Pitocin, and when the patient became 4-5 cm,  artificial rupture of membranes was employed.  The patient then labored  quickly and went on to normal spontaneous vaginal delivery.  She gave  birth to a 5 pound 4 ounce female infant with APGAR scores of 8 at 1  minute and 8 at 5 minutes.  Baby went to the NICU.  The patient has done  well postpartum; however, initially, she did experience mild uterine  atony which resolved with bimanual massage and Cytotec 1000 mg p.r.  Following this, her blood pressure dropped to 75/50, and she received  ephedrine.  The patient stabilized and was transferred to the Adult  Intensive Care Unit.  Please add that on admission, the patient was  begun on magnesium sulfate.  She received a 4 gm bolus and then 2 gm per  hour.  She continued on magnesium sulfate for 24 hours postpartum and  spent that first 24 hours in Adult ICU.  Her condition stabilized.  Her  blood pressures normalized between 110s-130s over 40s-80s, and she was  transferred to the floor.  Her hemoglobin on the first postpartum day  was 11.4 and her platelets 228,000.  Her chemistries were  normal for  pregnancy.  She diuresed well in the first 24 hours and has done well on  her second postpartum day as well.  Her blood pressures remain in the  120s to 130s over 80s to 90s level.  She reports no headache, visual  changes, or epigastric pain.  Her uterus is involuting appropriately,  and she has a moderate lochia.  Her extremities show 1+ edema.  Her DTRs  are now 1+ with no clonus.  She has a negative Homans sign bilaterally.  On this her second postpartum day, she is judged to be in satisfactory  condition with resolving preeclampsia to be discharged home.   DISCHARGE INSTRUCTIONS:  Per [employer] handout.   DISCHARGE MEDICATIONS:  1. Motrin 600 mg p.o. q.6 h. p.r.n. pain.  2. Tylox 1-2 p.o. q.3-4 h. p.r.n. pain.  3. Prenatal vitamins.   A [facility] nurse will come to the patient's home this week to  evaluate her blood pressure.  PIH signs and symptoms were reviewed with  the patient, and she will call for any headache, visual changes,  epigastric pain, or any problems or concerns.      Rica Koyanagi, C.N.M.      Naima A. Normand Sloop, M.D.  Electronically Signed    SDM/MEDQ  D:  04/26/2006  T:  04/26/2006  Job:  [id number]

## 2010-09-10 ENCOUNTER — Other Ambulatory Visit: Payer: Self-pay | Admitting: *Deleted

## 2010-09-10 MED ORDER — ESOMEPRAZOLE MAGNESIUM 40 MG PO CPDR: 40.0000 mg | DELAYED_RELEASE_CAPSULE | Freq: Every day | ORAL | Status: DC

## 2010-09-10 NOTE — Telephone Encounter (Signed)
Patient is requesting a new Rx for Nexium.  She stated that she lost the original Rx and now she needs to have the Rx sent to [facility] due to her insurance.  Patient hasn't been seen in a while she stated.  Please advise.

## 2010-09-10 NOTE — Telephone Encounter (Signed)
Will refill electronically  

## 2011-09-23 ENCOUNTER — Other Ambulatory Visit: Payer: Self-pay | Admitting: Family Medicine

## 2011-09-23 NOTE — Telephone Encounter (Signed)
Can refil 1 mo and 1 ref Make f/u appt with me

## 2011-09-23 NOTE — Telephone Encounter (Signed)
Request for Nexium 40 mg. Ok to refill? No OV in over a year.

## 2011-09-26 NOTE — Telephone Encounter (Signed)
Pt left v/m requesting status of Nexium refill. Pt contact # [phone].

## 2011-09-27 ENCOUNTER — Encounter: Payer: Self-pay | Admitting: Family Medicine

## 2011-09-27 ENCOUNTER — Ambulatory Visit (INDEPENDENT_AMBULATORY_CARE_PROVIDER_SITE_OTHER): Payer: 59 | Admitting: Family Medicine

## 2011-09-27 VITALS — BP 122/92 | HR 80 | Temp 97.5°F | Wt 180.8 lb

## 2011-09-27 DIAGNOSIS — R319 Hematuria, unspecified: Secondary | ICD-10-CM

## 2011-09-27 DIAGNOSIS — N390 Urinary tract infection, site not specified: Secondary | ICD-10-CM

## 2011-09-27 DIAGNOSIS — R310 Gross hematuria: Secondary | ICD-10-CM

## 2011-09-27 LAB — POCT URINALYSIS DIPSTICK
Glucose, UA: NEGATIVE
Nitrite, UA: POSITIVE
Protein, UA: 1
Spec Grav, UA: 1.025
Urobilinogen, UA: 0.2
pH, UA: 6

## 2011-09-27 MED ORDER — CIPROFLOXACIN HCL 500 MG PO TABS: 500.0000 mg | ORAL_TABLET | Freq: Two times a day (BID) | ORAL | Status: DC

## 2011-09-27 NOTE — Patient Instructions (Addendum)
You have a urinary infection. Treat with cipro twice daily for 3 days. Push fluids and rest - may take cranberry juice. If not improving as expected, please let us know. Good to see you today, call us with questions. There was a lot of blood in urine - I want you to return for lab visit in 3 weeks to recheck urine (urinalysis) to ensure blood has resolved.  Urinary Tract Infection Infections of the urinary tract can start in several places. A bladder infection (cystitis), a kidney infection (pyelonephritis), and a prostate infection (prostatitis) are different types of urinary tract infections (UTIs). They usually get better if treated with medicines (antibiotics) that kill germs. Take all the medicine until it is gone. You or your child may feel better in a few days, but TAKE ALL MEDICINE or the infection may not respond and may become more difficult to treat. HOME CARE INSTRUCTIONS   Drink enough water and fluids to keep the urine clear or pale yellow. Cranberry juice is especially recommended, in addition to large amounts of water.   Avoid caffeine, tea, and carbonated beverages. They tend to irritate the bladder.   Alcohol may irritate the prostate.   Only take over-the-counter or prescription medicines for pain, discomfort, or fever as directed by your caregiver.  To prevent further infections:  Empty the bladder often. Avoid holding urine for long periods of time.   After a bowel movement, women should cleanse from front to back. Use each tissue only once.   Empty the bladder before and after sexual intercourse.  FINDING OUT THE RESULTS OF YOUR TEST Not all test results are available during your visit. If your or your child's test results are not back during the visit, make an appointment with your caregiver to find out the results. Do not assume everything is normal if you have not heard from your caregiver or the medical facility. It is important for you to follow up on all test  results. SEEK MEDICAL CARE IF:   There is back pain.   Your baby is older than 3 months with a rectal temperature of 100.5 F (38.1 C) or higher for more than 1 day.   Your or your child's problems (symptoms) are no better in 3 days. Return sooner if you or your child is getting worse.  SEEK IMMEDIATE MEDICAL CARE IF:   There is severe back pain or lower abdominal pain.   You or your child develops chills.   You have a fever.   Your baby is older than 3 months with a rectal temperature of 102 F (38.9 C) or higher.   Your baby is 80 months old or younger with a rectal temperature of 100.4 F (38 C) or higher.   There is nausea or vomiting.   There is continued burning or discomfort with urination.  MAKE SURE YOU:   Understand these instructions.   Will watch your condition.   Will get help right away if you are not doing well or get worse.  Document Released: 10/06/2004 Document Revised: 12/16/2010 Document Reviewed: 05/11/2006 [facility] Patient Information 2012 [place], [place].

## 2011-09-27 NOTE — Assessment & Plan Note (Signed)
Return 2-3 wks for recheck UA to ensure hematuria resolved, attributing to hemorrhagic UTI.

## 2011-09-27 NOTE — Assessment & Plan Note (Addendum)
UTI with gross hematuria.  Anticipate hemorrhagic cystitis Treat as UTI with 3d cipro. Given amount of RBC, rec return in 2-3 wks for recheck UA to ensure blood cleared. See pt instructions for plan. No h/o frequent UTIs, no UCx sent.

## 2011-09-27 NOTE — Telephone Encounter (Signed)
Pt seeing Dr Sharen Hones today; [place] will let pt know refill sent to [facility] and pt needs schedule f/u with Dr Milinda Antis.

## 2011-09-27 NOTE — Progress Notes (Signed)
  Subjective:    Patient ID: Joann Davis, female    DOB: Dec 19, 1970, 41 y.o.   MRN: [id number]  HPI CC: ?UTI  Last UTI was 7 yrs ago.  Same sxs.  2d h/o hematuria, dysuria, urgency, frequency. Some lower abd discomfort/cramp.  Today with dark urine.    No back pain, n/v/f/c.  No leg swelling.  Past Medical History  Diagnosis Date  . GERD (gastroesophageal reflux disease)   . Pre-eclampsia   . Elevated blood pressure reading without diagnosis of hypertension   . Migraine with aura, without mention of intractable migraine without mention of status migrainosus   . Temporomandibular joint disorders, unspecified      Review of Systems Per HPI    Objective:   Physical Exam  Nursing note and vitals reviewed. Constitutional: She appears well-developed and well-nourished. No distress.  Abdominal: Soft. Bowel sounds are normal. She exhibits no distension and no mass. There is no hepatosplenomegaly. There is no tenderness. There is no rigidity, no rebound, no guarding, no CVA tenderness and negative Murphy's sign.  Psychiatric: She has a normal mood and affect.       Assessment & Plan:  UA - large LE, nitr pos, large blood

## 2011-10-25 ENCOUNTER — Ambulatory Visit: Payer: 59 | Admitting: Family Medicine

## 2011-10-25 ENCOUNTER — Ambulatory Visit (INDEPENDENT_AMBULATORY_CARE_PROVIDER_SITE_OTHER): Payer: 59 | Admitting: *Deleted

## 2011-10-25 DIAGNOSIS — R319 Hematuria, unspecified: Secondary | ICD-10-CM

## 2011-10-25 LAB — POCT URINALYSIS DIPSTICK
Bilirubin, UA: NEGATIVE
Blood, UA: NEGATIVE
Glucose, UA: NEGATIVE
Ketones, UA: NEGATIVE
Leukocytes, UA: NEGATIVE
Nitrite, UA: NEGATIVE
Protein, UA: NEGATIVE
Spec Grav, UA: >=1.03
Urobilinogen, UA: 0.2
pH, UA: 6

## 2011-10-25 NOTE — Progress Notes (Signed)
plz notify urine cleared of blood after infection.

## 2011-10-26 ENCOUNTER — Ambulatory Visit: Payer: 59 | Admitting: Family Medicine

## 2011-10-31 NOTE — Progress Notes (Signed)
Patient notified

## 2011-11-20 ENCOUNTER — Telehealth: Payer: Self-pay | Admitting: Family Medicine

## 2011-11-20 DIAGNOSIS — Z0000 Encounter for general adult medical examination without abnormal findings: Secondary | ICD-10-CM | POA: Insufficient documentation

## 2011-11-20 NOTE — Telephone Encounter (Signed)
Message copied by Judy Pimple on Sun Nov 20, 2011  6:22 PM ------      Message from: Baldomero Lamy      Created: Fri Nov 11, 2011 10:39 AM      Regarding: Cpx labs Mon 11/11       Please order  future cpx labs for pt's upcomming lab appt.      Thanks      Rodney Booze

## 2011-11-21 ENCOUNTER — Other Ambulatory Visit (INDEPENDENT_AMBULATORY_CARE_PROVIDER_SITE_OTHER): Payer: 59

## 2011-11-21 DIAGNOSIS — Z0000 Encounter for general adult medical examination without abnormal findings: Secondary | ICD-10-CM

## 2011-11-21 LAB — COMPREHENSIVE METABOLIC PANEL
ALT: 15 U/L (ref 0–35)
AST: 17 U/L (ref 0–37)
Albumin: 3.8 g/dL (ref 3.5–5.2)
Alkaline Phosphatase: 55 U/L (ref 39–117)
BUN: 8 mg/dL (ref 6–23)
CO2: 28 mEq/L (ref 19–32)
Calcium: 8.9 mg/dL (ref 8.4–10.5)
Chloride: 104 mEq/L (ref 96–112)
Creatinine, Ser: 0.5 mg/dL (ref 0.4–1.2)
GFR: 143.85 mL/min (ref >60.00)
Glucose, Bld: 99 mg/dL (ref 70–99)
Potassium: 3.9 mEq/L (ref 3.5–5.1)
Sodium: 139 mEq/L (ref 135–145)
Total Bilirubin: 0.4 mg/dL (ref 0.3–1.2)
Total Protein: 6.8 g/dL (ref 6.0–8.3)

## 2011-11-21 LAB — CBC WITH DIFFERENTIAL/PLATELET
Basophils Absolute: 0 10*3/uL (ref 0.0–0.1)
Basophils Relative: 0.4 % (ref 0.0–3.0)
Eosinophils Absolute: 0.1 10*3/uL (ref 0.0–0.7)
Eosinophils Relative: 1.3 % (ref 0.0–5.0)
HCT: 34.3 % — ABNORMAL LOW (ref 36.0–46.0)
Hemoglobin: 11.1 g/dL — ABNORMAL LOW (ref 12.0–15.0)
Lymphocytes Relative: 24.7 % (ref 12.0–46.0)
Lymphs Abs: 1.9 10*3/uL (ref 0.7–4.0)
MCHC: 32.4 g/dL (ref 30.0–36.0)
MCV: 87.3 fl (ref 78.0–100.0)
Monocytes Absolute: 0.6 10*3/uL (ref 0.1–1.0)
Monocytes Relative: 7.8 % (ref 3.0–12.0)
Neutro Abs: 4.9 10*3/uL (ref 1.4–7.7)
Neutrophils Relative %: 65.8 % (ref 43.0–77.0)
Platelets: 490 10*3/uL — ABNORMAL HIGH (ref 150.0–400.0)
RBC: 3.93 Mil/uL (ref 3.87–5.11)
RDW: 14.5 % (ref 11.5–14.6)
WBC: 7.5 10*3/uL (ref 4.5–10.5)

## 2011-11-21 LAB — LIPID PANEL
Cholesterol: 173 mg/dL (ref 0–200)
HDL: 39.1 mg/dL (ref >39.00)
LDL Cholesterol: 123 mg/dL — ABNORMAL HIGH (ref 0–99)
Total CHOL/HDL Ratio: 4
Triglycerides: 55 mg/dL (ref 0.0–149.0)
VLDL: 11 mg/dL (ref 0.0–40.0)

## 2011-11-21 LAB — TSH: TSH: 0.46 u[IU]/mL (ref 0.35–5.50)

## 2011-11-24 ENCOUNTER — Other Ambulatory Visit: Payer: Self-pay | Admitting: Family Medicine

## 2011-12-02 ENCOUNTER — Encounter: Payer: Self-pay | Admitting: Family Medicine

## 2011-12-02 ENCOUNTER — Ambulatory Visit (INDEPENDENT_AMBULATORY_CARE_PROVIDER_SITE_OTHER): Payer: 59 | Admitting: Family Medicine

## 2011-12-02 VITALS — BP 104/62 | HR 78 | Temp 98.4°F | Ht 66.5 in | Wt 178.2 lb

## 2011-12-02 DIAGNOSIS — Z0000 Encounter for general adult medical examination without abnormal findings: Secondary | ICD-10-CM

## 2011-12-02 DIAGNOSIS — Z23 Encounter for immunization: Secondary | ICD-10-CM

## 2011-12-02 MED ORDER — ESOMEPRAZOLE MAGNESIUM 40 MG PO CPDR: 40.0000 mg | DELAYED_RELEASE_CAPSULE | Freq: Every day | ORAL | Status: DC

## 2011-12-02 NOTE — Assessment & Plan Note (Signed)
Reviewed health habits including diet and exercise and skin cancer prevention Also reviewed health mt list, fam hx and immunizations  Rev wellness labs Rev low sat fat diet and need for wt loss Tdap today Flu vaccine today

## 2011-12-02 NOTE — Patient Instructions (Addendum)
Tdap vaccine  Flu vaccine  Avoid red meat/ fried foods/ egg yolks/ fatty breakfast meats/ butter, cheese and high fat dairy/ and shellfish   Try to exercise and take good care of yourself

## 2011-12-02 NOTE — Progress Notes (Signed)
Subjective:    Patient ID: Joann Davis, female    DOB: 07-Apr-1970, 41 y.o.   MRN: [id number]  HPI Here for health maintenance exam and to review chronic medical problems   Work is giving a Optician, dispensing  Will bring her form if necessary  Is feeling pretty good    Wt is down 2 lb with bmi of 28 Is working on it- it was creeping back up  Is using a gym at work 20 min a day-elliptical machine    Gyn care - goes to gyn - last visit about a year ago  Last pap was ok  Has not had a mammogram - was breastfeeding a year ago  She will see about getting it there   Tdap vaccine  Needs one of those   Flu vacc- needs one today   Mammogram-try to get at gyn  Self exam -no lumps or changes   bp good today  Happy with that    Mood-is doing ok  No tearfulness or lack of motivation   Anemia Lab Results  Component Value Date   WBC 7.5 11/21/2011   HGB 11.1* 11/21/2011   HCT 34.3* 11/21/2011   MCV 87.3 11/21/2011   PLT 490.0* 11/21/2011   she donated double red blood cells right before her test   Lab Results  Component Value Date   CHOL 173 11/21/2011   HDL 39.10 11/21/2011   LDLCALC 123* 11/21/2011   TRIG 55.0 11/21/2011   CHOLHDL 4 11/21/2011   diet is not the best for sat fats   Patient Active Problem List  Diagnosis  . MIGRAINE WITH AURA  . TEMPOROMANDIBULAR JOINT DISORDER  . GERD  . ELEVATED BLOOD PRESSURE  . UTI (lower urinary tract infection)  . Gross hematuria  . Routine general medical examination at a health care facility   Past Medical History  Diagnosis Date  . GERD (gastroesophageal reflux disease)   . Pre-eclampsia   . Elevated blood pressure reading without diagnosis of hypertension   . Migraine with aura, without mention of intractable migraine without mention of status migrainosus   . Temporomandibular joint disorders, unspecified    No past surgical history on file. History  Substance Use Topics  . Smoking status: Never Smoker    . Smokeless tobacco: Not on file  . Alcohol Use: Yes     Comment: Occasionally (light)   Family History  Problem Relation Age of Onset  . Stroke Father 39    x 2  . Diabetes type II Father   . Hypertension Mother   . Stroke Paternal Grandmother 85  . Diabetes Paternal Grandfather   . Breast cancer Maternal Grandmother   . Colon cancer Maternal Aunt   . Colon cancer Maternal Uncle    Allergies  Allergen Reactions  . Monistat (Miconazole)    Current Outpatient Prescriptions on File Prior to Visit  Medication Sig Dispense Refill  . Multiple Vitamin (MULTIVITAMIN) capsule Take 1 capsule by mouth daily.      Marland Kitchen NEXIUM 40 MG capsule TAKE 1 CAPSULE BY MOUTH ONCE A DAY BEFORE BREAKFAST  30 capsule  0      Review of Systems Review of Systems  Constitutional: Negative for fever, appetite change, fatigue and unexpected weight change.  Eyes: Negative for pain and visual disturbance.  Respiratory: Negative for cough and shortness of breath.   Cardiovascular: Negative for cp or palpitations    Gastrointestinal: Negative for nausea, diarrhea and constipation.  Genitourinary: Negative  for urgency and frequency.  Skin: Negative for pallor or rash   Neurological: Negative for weakness, light-headedness, numbness and headaches.  Hematological: Negative for adenopathy. Does not bruise/bleed easily.  Psychiatric/Behavioral: Negative for dysphoric mood. The patient is not nervous/anxious.         Objective:   Physical Exam  Constitutional: She appears well-developed and well-nourished. No distress.  HENT:  Head: Normocephalic and atraumatic.  Right Ear: External ear normal.  Left Ear: External ear normal.  Nose: Nose normal.  Mouth/Throat: Oropharynx is clear and moist.  Eyes: Conjunctivae normal and EOM are normal. Pupils are equal, round, and reactive to light. Right eye exhibits no discharge. Left eye exhibits no discharge. No scleral icterus.  Neck: Normal range of motion. Neck  supple. No JVD present. Carotid bruit is not present. No thyromegaly present.  Cardiovascular: Normal rate, regular rhythm, normal heart sounds and intact distal pulses.  Exam reveals no gallop.   Pulmonary/Chest: Effort normal and breath sounds normal. No respiratory distress. She has no wheezes.  Abdominal: Soft. Bowel sounds are normal. She exhibits no distension, no abdominal bruit and no mass. There is no tenderness.  Musculoskeletal: She exhibits no edema and no tenderness.  Lymphadenopathy:    She has no cervical adenopathy.  Neurological: She is alert. She has normal reflexes. No cranial nerve deficit. She exhibits normal muscle tone. Coordination normal.  Skin: Skin is warm and dry. No rash noted. No erythema. No pallor.  Psychiatric: She has a normal mood and affect.          Assessment & Plan:

## 2012-01-19 ENCOUNTER — Encounter: Payer: Self-pay | Admitting: Family Medicine

## 2012-01-19 ENCOUNTER — Ambulatory Visit (INDEPENDENT_AMBULATORY_CARE_PROVIDER_SITE_OTHER): Payer: 59 | Admitting: Family Medicine

## 2012-01-19 VITALS — BP 120/82 | HR 78 | Temp 97.8°F | Ht 65.5 in | Wt 182.5 lb

## 2012-01-19 DIAGNOSIS — J209 Acute bronchitis, unspecified: Secondary | ICD-10-CM

## 2012-01-19 MED ORDER — AZITHROMYCIN 250 MG PO TABS: ORAL_TABLET | ORAL | Status: DC

## 2012-01-19 NOTE — Progress Notes (Signed)
Nature conservation officer at [facility] [address] [place] [address] Phone: [phone] Fax: [phone]  Date:  01/19/2012   Name:  SHANTAY SONN   DOB:  Apr 04, 1970   MRN:  [id number] Gender: female Age: 42 y.o.  PCP:  Roxy Manns, MD  Evaluating MD: Hannah Beat, MD   Chief Complaint: Cough   History of Present Illness:  Carmellia Kreisler Osmundson is a 42 y.o. pleasant patient who presents with the following:  Cold -- and cough, more lik eeight days. Yesterday, has talked all day at work, and BP was up some at work. 157/100.   Ha sa headache this morning, jaw some on the R. No fever.   Acute Bronchitis: Patient presents for presents evaluation of headache , nasal congestion, productive cough with sputum described as white and yellow and jaw pain. Symptoms began 8 days ago and are gradually worsening since that time.  Past history is significant for occasional episodes of bronchitis.    Patient Active Problem List  Diagnosis  . MIGRAINE WITH AURA  . TEMPOROMANDIBULAR JOINT DISORDER  . GERD  . ELEVATED BLOOD PRESSURE  . UTI (lower urinary tract infection)  . Gross hematuria  . Routine general medical examination at a health care facility    Past Medical History  Diagnosis Date  . GERD (gastroesophageal reflux disease)   . Pre-eclampsia   . Elevated blood pressure reading without diagnosis of hypertension   . Migraine with aura, without mention of intractable migraine without mention of status migrainosus   . Temporomandibular joint disorders, unspecified     No past surgical history on file.  History  Substance Use Topics  . Smoking status: Never Smoker   . Smokeless tobacco: Not on file  . Alcohol Use: Yes     Comment: Occasionally (light)    Family History  Problem Relation Age of Onset  . Stroke Father 39    x 2  . Diabetes type II Father   . Hypertension Mother   . Stroke Paternal Grandmother 3  . Diabetes Paternal Grandfather   . Breast cancer  Maternal Grandmother   . Colon cancer Maternal Aunt   . Colon cancer Maternal Uncle     Allergies  Allergen Reactions  . Monistat (Miconazole)     Medication list has been reviewed and updated.  Outpatient Prescriptions Prior to Visit  Medication Sig Dispense Refill  . esomeprazole (NEXIUM) 40 MG capsule Take 1 capsule (40 mg total) by mouth daily before breakfast.  30 capsule  11  . Multiple Vitamin (MULTIVITAMIN) capsule Take 1 capsule by mouth daily.       Last reviewed on 01/19/2012  9:12 AM by Hannah Beat, MD  Review of Systems:  ROS: GEN: Acute illness details above GI: Tolerating PO intake GU: maintaining adequate hydration and urination Pulm: No SOB Interactive and getting along well at home.  Otherwise, ROS is as per the HPI.   Physical Examination: BP 120/82  Pulse 78  Temp 97.8 F (36.6 C) (Oral)  Ht 5' 5.5" (1.664 m)  Wt 182 lb 8 oz (82.781 kg)  BMI 29.91 kg/m2  SpO2 98%  Ideal Body Weight: Weight in (lb) to have BMI = 25: 152.2    GEN: A and O x 3. WDWN. NAD.    ENT: Nose clear, ext NML.  No LAD.  No JVD.  TM's clear. Oropharynx clear.  PULM: Normal WOB, no distress. No crackles, wheezes, rhonchi. CV: RRR, no M/G/R, No rubs, No  JVD.   EXT: warm and well-perfused, No c/c/e. PSYCH: Pleasant and conversant.   Assessment and Plan:  1. Acute bronchitis    Acute bronchitis: discussed plan of care. Given length of symptoms and overall history, will treat with ABX in this case. Continue with additional supportive care, cough medications, liquids, sleep, steam / vaporizer. Nyquil at night  Orders Today:  No orders of the defined types were placed in this encounter.    Updated Medication List: (Includes new medications, updates to list, dose adjustments) Meds ordered this encounter  Medications  . azithromycin (ZITHROMAX) 250 MG tablet    Sig: 2 tabs po on day 1, then 1 tab po for 4 days    Dispense:  6 tablet    Refill:  0    Medications  Discontinued: There are no discontinued medications.   Hannah Beat, MD

## 2012-03-16 ENCOUNTER — Ambulatory Visit: Payer: Self-pay | Admitting: Certified Nurse Midwife

## 2012-04-05 ENCOUNTER — Other Ambulatory Visit: Payer: Self-pay | Admitting: Obstetrics and Gynecology

## 2012-04-06 LAB — PAP IG W/ RFLX HPV ASCU

## 2012-04-09 ENCOUNTER — Other Ambulatory Visit: Payer: Self-pay | Admitting: Obstetrics and Gynecology

## 2012-04-09 DIAGNOSIS — Z1231 Encounter for screening mammogram for malignant neoplasm of breast: Secondary | ICD-10-CM

## 2012-05-11 ENCOUNTER — Ambulatory Visit
Admission: RE | Admit: 2012-05-11 | Discharge: 2012-05-11 | Disposition: A | Discharge status: Home / Self Care | Payer: 59 | Source: Ambulatory Visit | Attending: Obstetrics and Gynecology | Admitting: Obstetrics and Gynecology

## 2012-05-11 DIAGNOSIS — Z1231 Encounter for screening mammogram for malignant neoplasm of breast: Secondary | ICD-10-CM

## 2012-12-02 ENCOUNTER — Other Ambulatory Visit: Payer: Self-pay | Admitting: Family Medicine

## 2013-01-11 ENCOUNTER — Telehealth: Payer: Self-pay

## 2013-01-11 NOTE — Telephone Encounter (Signed)
Pt left v/m and pts insurance co no longer covers Nexium and wants to know if can be changed to Protonix. Pt last seen 11/2011 for ck up.Please advise.

## 2013-01-13 MED ORDER — PANTOPRAZOLE SODIUM 40 MG PO TBEC: 40.0000 mg | DELAYED_RELEASE_TABLET | Freq: Every day | ORAL | Status: DC

## 2013-01-13 NOTE — Telephone Encounter (Signed)
I sent protonix to her pharmacy on file - schedule PE please in late spring

## 2013-01-15 NOTE — Telephone Encounter (Signed)
Patient advised. She will call back later to schedule CPE.

## 2013-07-15 ENCOUNTER — Other Ambulatory Visit: Payer: Self-pay | Admitting: Family Medicine

## 2013-07-30 ENCOUNTER — Encounter: Payer: Self-pay | Admitting: Family Medicine

## 2013-07-30 ENCOUNTER — Ambulatory Visit (INDEPENDENT_AMBULATORY_CARE_PROVIDER_SITE_OTHER): Payer: 59 | Admitting: Family Medicine

## 2013-07-30 VITALS — BP 108/68 | HR 75 | Temp 97.8°F | Wt 182.0 lb

## 2013-07-30 DIAGNOSIS — M791 Myalgia, unspecified site: Secondary | ICD-10-CM | POA: Insufficient documentation

## 2013-07-30 DIAGNOSIS — IMO0001 Reserved for inherently not codable concepts without codable children: Secondary | ICD-10-CM

## 2013-07-30 MED ORDER — CYCLOBENZAPRINE HCL 10 MG PO TABS: 5.0000 mg | ORAL_TABLET | Freq: Three times a day (TID) | ORAL | Status: DC | PRN

## 2013-07-30 NOTE — Assessment & Plan Note (Signed)
L trap ttp, likely a trapezius strain.  D/w pt.  Would use flexeril.  The GI sx likely from tramadol d/w pt.  Okay for outpatient f/u.  Should resolve as the med effect wears off.  Added to intolerant list.   She had a driver today to take her home.   Call back as needed.  Routed to PCP as FYI.

## 2013-07-30 NOTE — Progress Notes (Signed)
Pre visit review using our clinic review tool, if applicable. No additional management support is needed unless otherwise documented below in the visit note.  She has a hx of injury in L shoulder about 10 years ago.  She'll occ have a flare, with pulling.  Was getting out of the pool yesterday and felt it start again.  This is a typical set of sx for her.  L shoulder and upper back pain.  Husband had some tramadol, she took a dose but started vomiting. Has been nauseated since then.  She felt better after vomiting this AM. Hasn't eating much in the meantime. She tends to avoid pain meds since she doesn't tolerate them well.  No other focal neuro sx.  No fevers.  No fevers.  No diarrhea.  She had taken flexeril prev with some relief for her shoulder sx.    She does have sick contacts at home.    Meds, vitals, and allergies reviewed.   ROS: See HPI.  Otherwise, noncontributory.  nad ncat Mmm Neck supple, but L trap ttp No weakness in the shoulders/arms rrr ctab abd soft, not ttp, no rebound  She vomited in the office, felt some much afterward.   She isn't lightheaded and her color is normal after vomiting.  She looks visibly relieved.

## 2013-07-30 NOTE — Patient Instructions (Signed)
I think the vomiting was from the tramadol.  Use the flexeril and a heating pad.  Take care.   Glad to see you.

## 2013-08-21 ENCOUNTER — Other Ambulatory Visit: Payer: Self-pay | Admitting: Family Medicine

## 2013-08-27 ENCOUNTER — Ambulatory Visit (INDEPENDENT_AMBULATORY_CARE_PROVIDER_SITE_OTHER): Payer: 59 | Admitting: Family Medicine

## 2013-08-27 ENCOUNTER — Encounter: Payer: Self-pay | Admitting: Family Medicine

## 2013-08-27 VITALS — BP 112/76 | HR 75 | Temp 98.2°F | Ht 66.75 in | Wt 180.8 lb

## 2013-08-27 DIAGNOSIS — Z0000 Encounter for general adult medical examination without abnormal findings: Secondary | ICD-10-CM

## 2013-08-27 LAB — CBC WITH DIFFERENTIAL/PLATELET
Basophils Absolute: 0 10*3/uL (ref 0.0–0.1)
Basophils Relative: 0.6 % (ref 0.0–3.0)
Eosinophils Absolute: 0.1 10*3/uL (ref 0.0–0.7)
Eosinophils Relative: 1.3 % (ref 0.0–5.0)
HCT: 37.4 % (ref 36.0–46.0)
Hemoglobin: 12.1 g/dL (ref 12.0–15.0)
Lymphocytes Relative: 26 % (ref 12.0–46.0)
Lymphs Abs: 1.5 10*3/uL (ref 0.7–4.0)
MCHC: 32.5 g/dL (ref 30.0–36.0)
MCV: 82.8 fl (ref 78.0–100.0)
Monocytes Absolute: 0.7 10*3/uL (ref 0.1–1.0)
Monocytes Relative: 11.4 % (ref 3.0–12.0)
Neutro Abs: 3.6 10*3/uL (ref 1.4–7.7)
Neutrophils Relative %: 60.7 % (ref 43.0–77.0)
Platelets: 437 10*3/uL — ABNORMAL HIGH (ref 150.0–400.0)
RBC: 4.51 Mil/uL (ref 3.87–5.11)
RDW: 13.7 % (ref 11.5–15.5)
WBC: 5.9 10*3/uL (ref 4.0–10.5)

## 2013-08-27 LAB — COMPREHENSIVE METABOLIC PANEL
ALT: 13 U/L (ref 0–35)
AST: 16 U/L (ref 0–37)
Albumin: 3.9 g/dL (ref 3.5–5.2)
Alkaline Phosphatase: 48 U/L (ref 39–117)
BUN: 9 mg/dL (ref 6–23)
CO2: 26 mEq/L (ref 19–32)
Calcium: 9.3 mg/dL (ref 8.4–10.5)
Chloride: 104 mEq/L (ref 96–112)
Creatinine, Ser: 0.6 mg/dL (ref 0.4–1.2)
GFR: 109.25 mL/min (ref >60.00)
Glucose, Bld: 88 mg/dL (ref 70–99)
Potassium: 4 mEq/L (ref 3.5–5.1)
Sodium: 139 mEq/L (ref 135–145)
Total Bilirubin: 0.6 mg/dL (ref 0.2–1.2)
Total Protein: 7.4 g/dL (ref 6.0–8.3)

## 2013-08-27 LAB — LIPID PANEL
Cholesterol: 191 mg/dL (ref 0–200)
HDL: 40.4 mg/dL (ref >39.00)
LDL Cholesterol: 133 mg/dL — ABNORMAL HIGH (ref 0–99)
NonHDL: 150.6
Total CHOL/HDL Ratio: 5
Triglycerides: 89 mg/dL (ref 0.0–149.0)
VLDL: 17.8 mg/dL (ref 0.0–40.0)

## 2013-08-27 LAB — TSH: TSH: 0.34 u[IU]/mL — ABNORMAL LOW (ref 0.35–4.50)

## 2013-08-27 MED ORDER — PANTOPRAZOLE SODIUM 40 MG PO TBEC: DELAYED_RELEASE_TABLET | ORAL | Status: DC

## 2013-08-27 NOTE — Progress Notes (Signed)
Pre visit review using our clinic review tool, if applicable. No additional management support is needed unless otherwise documented below in the visit note. 

## 2013-08-27 NOTE — Assessment & Plan Note (Signed)
Reviewed health habits including diet and exercise and skin cancer prevention Reviewed appropriate screening tests for age  Also reviewed health mt list, fam hx and immunization status , as well as social and family history   Wellness labs today Disc DASH diet and exercise plan She will schedule her own mammogram Will get a flu shot next mo

## 2013-08-27 NOTE — Progress Notes (Signed)
Subjective:    Patient ID: Joann Davis, female    DOB: 12-16-1970, 43 y.o.   MRN: [id number]  HPI Here for health maintenance exam and to review chronic medical problems   Doing great overall   A busy summer - got a lot done around the house   Wt is down 2 lb with bmi of 28 Taking fair care of herself  Not eating very healthy  Has a new exercise plan - she started moving around work a lot more (because she has a sedentary job)- and uses an Engineer, materials during lunch    Mammogram 5/14 nl -has not had her 2015 mammo yet  Will make that appt herself  Self exam-no lumps  Flu vaccine - got one last fall - will get one in the fall   Pap 3/14 nl per our chart  She sees a gyn - last week - and she will do them every 3 years   Menstrual hx - normal periods  Is not trying to get pregnant - husb got a vasectomy   Td 11/13 up to date   Will do labs today   Patient Active Problem List   Diagnosis Date Noted  . Muscle pain 07/30/2013  . Routine general medical examination at a health care facility 11/20/2011  . Gross hematuria 09/27/2011  . MIGRAINE WITH AURA 02/11/2009  . ELEVATED BLOOD PRESSURE 10/03/2008  . TEMPOROMANDIBULAR JOINT DISORDER 10/25/2006  . GERD 10/24/2006   Past Medical History  Diagnosis Date  . GERD (gastroesophageal reflux disease)   . Pre-eclampsia   . Elevated blood pressure reading without diagnosis of hypertension   . Migraine with aura, without mention of intractable migraine without mention of status migrainosus   . Temporomandibular joint disorders, unspecified    No past surgical history on file. History  Substance Use Topics  . Smoking status: Never Smoker   . Smokeless tobacco: Not on file  . Alcohol Use: Yes     Comment: Occasionally (light)   Family History  Problem Relation Age of Onset  . Stroke Father 39    x 2  . Diabetes type II Father   . Hypertension Mother   . Stroke Paternal Grandmother 80  . Diabetes Paternal  Grandfather   . Breast cancer Maternal Grandmother   . Colon cancer Maternal Aunt   . Colon cancer Maternal Uncle    Allergies  Allergen Reactions  . Monistat [Miconazole]   . Tramadol     Nausea and vomiting   Current Outpatient Prescriptions on File Prior to Visit  Medication Sig Dispense Refill  . pantoprazole (PROTONIX) 40 MG tablet TAKE 1 TABLET (40 MG TOTAL) BY MOUTH DAILY.  30 tablet  0   No current facility-administered medications on file prior to visit.      Review of Systems    Review of Systems  Constitutional: Negative for fever, appetite change, fatigue and unexpected weight change.  Eyes: Negative for pain and visual disturbance.  Respiratory: Negative for cough and shortness of breath.   Cardiovascular: Negative for cp or palpitations    Gastrointestinal: Negative for nausea, diarrhea and constipation.  Genitourinary: Negative for urgency and frequency.  Skin: Negative for pallor or rash   Neurological: Negative for weakness, light-headedness, numbness and headaches.  Hematological: Negative for adenopathy. Does not bruise/bleed easily.  Psychiatric/Behavioral: Negative for dysphoric mood. The patient is not nervous/anxious.      Objective:   Physical Exam  Constitutional: She appears well-developed and well-nourished.  No distress.  overwt and well app  HENT:  Head: Normocephalic and atraumatic.  Right Ear: External ear normal.  Left Ear: External ear normal.  Mouth/Throat: Oropharynx is clear and moist.  Eyes: Conjunctivae and EOM are normal. Pupils are equal, round, and reactive to light. No scleral icterus.  Neck: Normal range of motion. Neck supple. No JVD present. Carotid bruit is not present. No thyromegaly present.  Cardiovascular: Normal rate, regular rhythm, normal heart sounds and intact distal pulses.  Exam reveals no gallop.   Pulmonary/Chest: Effort normal and breath sounds normal. No respiratory distress. She has no wheezes. She exhibits no  tenderness.  Abdominal: Soft. Bowel sounds are normal. She exhibits no distension, no abdominal bruit and no mass. There is no tenderness.  Genitourinary: No breast swelling, tenderness, discharge or bleeding.  Breast exam: No mass, nodules, thickening, tenderness, bulging, retraction, inflamation, nipple discharge or skin changes noted.  No axillary or clavicular LA.      Musculoskeletal: Normal range of motion. She exhibits no edema and no tenderness.  Lymphadenopathy:    She has no cervical adenopathy.  Neurological: She is alert. She has normal reflexes. No cranial nerve deficit. She exhibits normal muscle tone. Coordination normal.  Skin: Skin is warm and dry. No rash noted. No erythema. No pallor.  Psychiatric: She has a normal mood and affect.          Assessment & Plan:   Problem List Items Addressed This Visit     Other   Routine general medical examination at a health care facility - Primary     Reviewed health habits including diet and exercise and skin cancer prevention Reviewed appropriate screening tests for age  Also reviewed health mt list, fam hx and immunization status , as well as social and family history   Wellness labs today Disc DASH diet and exercise plan She will schedule her own mammogram Will get a flu shot next mo    Relevant Orders      CBC with Differential (Completed)      Comprehensive metabolic panel (Completed)      TSH (Completed)      Lipid panel (Completed)

## 2013-08-27 NOTE — Patient Instructions (Addendum)
Don't forget to schedule your mammogram appointment  Get your flu shot in the fall Labs today  Keep exercising     DASH Eating Plan DASH stands for "Dietary Approaches to Stop Hypertension." The DASH eating plan is a healthy eating plan that has been shown to reduce high blood pressure (hypertension). Additional health benefits may include reducing the risk of type 2 diabetes mellitus, heart disease, and stroke. The DASH eating plan may also help with weight loss. WHAT DO I NEED TO KNOW ABOUT THE DASH EATING PLAN? For the DASH eating plan, you will follow these general guidelines:  Choose foods with a percent daily value for sodium of less than 5% (as listed on the food label).  Use salt-free seasonings or herbs instead of table salt or sea salt.  Check with your health care provider or pharmacist before using salt substitutes.  Eat lower-sodium products, often labeled as "lower sodium" or "no salt added."  Eat fresh foods.  Eat more vegetables, fruits, and low-fat dairy products.  Choose whole grains. Look for the word "whole" as the first word in the ingredient list.  Choose fish and skinless chicken or [place] more often than red meat. Limit fish, poultry, and meat to 6 oz (170 g) each day.  Limit sweets, desserts, sugars, and sugary drinks.  Choose heart-healthy fats.  Limit cheese to 1 oz (28 g) per day.  Eat more home-cooked food and less restaurant, buffet, and fast food.  Limit fried foods.  Cook foods using methods other than frying.  Limit canned vegetables. If you do use them, rinse them well to decrease the sodium.  When eating at a restaurant, ask that your food be prepared with less salt, or no salt if possible. WHAT FOODS CAN I EAT? Seek help from a dietitian for individual calorie needs. Grains Whole grain or whole wheat bread. Brown rice. Whole grain or whole wheat pasta. Quinoa, bulgur, and whole grain cereals. Low-sodium cereals. Corn or whole wheat  flour tortillas. Whole grain cornbread. Whole grain crackers. Low-sodium crackers. Vegetables Fresh or frozen vegetables (raw, steamed, roasted, or grilled). Low-sodium or reduced-sodium tomato and vegetable juices. Low-sodium or reduced-sodium tomato sauce and paste. Low-sodium or reduced-sodium canned vegetables.  Fruits All fresh, canned (in natural juice), or frozen fruits. Meat and Other Protein Products Ground beef (85% or leaner), grass-fed beef, or beef trimmed of fat. Skinless chicken or [place]key. Ground chicken or [place]key. Pork trimmed of fat. All fish and seafood. Eggs. Dried beans, peas, or lentils. Unsalted nuts and seeds. Unsalted canned beans. Dairy Low-fat dairy products, such as skim or 1% milk, 2% or reduced-fat cheeses, low-fat ricotta or cottage cheese, or plain low-fat yogurt. Low-sodium or reduced-sodium cheeses. Fats and Oils Tub margarines without trans fats. Light or reduced-fat mayonnaise and salad dressings (reduced sodium). Avocado. Safflower, olive, or canola oils. Natural peanut or almond butter. Other Unsalted popcorn and pretzels. The items listed above may not be a complete list of recommended foods or beverages. Contact your dietitian for more options. WHAT FOODS ARE NOT RECOMMENDED? Grains White bread. White pasta. White rice. Refined cornbread. Bagels and croissants. Crackers that contain trans fat. Vegetables Creamed or fried vegetables. Vegetables in a cheese sauce. Regular canned vegetables. Regular canned tomato sauce and paste. Regular tomato and vegetable juices. Fruits Dried fruits. Canned fruit in light or heavy syrup. Fruit juice. Meat and Other Protein Products Fatty cuts of meat. Ribs, chicken wings, bacon, sausage, bologna, salami, chitterlings, fatback, hot dogs, bratwurst, and packaged luncheon  meats. Salted nuts and seeds. Canned beans with salt. Dairy Whole or 2% milk, cream, half-and-half, and cream cheese. Whole-fat or sweetened yogurt.  Full-fat cheeses or blue cheese. Nondairy creamers and whipped toppings. Processed cheese, cheese spreads, or cheese curds. Condiments Onion and garlic salt, seasoned salt, table salt, and sea salt. Canned and packaged gravies. Worcestershire sauce. Tartar sauce. Barbecue sauce. Teriyaki sauce. Soy sauce, including reduced sodium. Steak sauce. Fish sauce. Oyster sauce. Cocktail sauce. Horseradish. Ketchup and mustard. Meat flavorings and tenderizers. Bouillon cubes. Hot sauce. Tabasco sauce. Marinades. Taco seasonings. Relishes. Fats and Oils Butter, stick margarine, lard, shortening, ghee, and bacon fat. Coconut, palm kernel, or palm oils. Regular salad dressings. Other Pickles and olives. Salted popcorn and pretzels. The items listed above may not be a complete list of foods and beverages to avoid. Contact your dietitian for more information. WHERE CAN I FIND MORE INFORMATION? National Heart, Lung, and Blood Institute: www.nhlbi.nih.gov/health/health-topics/topics/daCablePromo.itsh/ Document Released: 12/16/2010 Document Revised: 05/13/2013 Document Reviewed: 10/31/2012 [facility] Patient Information 2015 [place], [place]. This information is not intended to replace advice given to you by your health care provider. Make sure you discuss any questions you have with your health care provider.

## 2013-08-29 ENCOUNTER — Encounter: Payer: Self-pay | Admitting: *Deleted

## 2013-09-23 ENCOUNTER — Other Ambulatory Visit: Payer: Self-pay | Admitting: Family Medicine

## 2013-12-12 ENCOUNTER — Other Ambulatory Visit: Payer: 59

## 2013-12-16 ENCOUNTER — Other Ambulatory Visit (INDEPENDENT_AMBULATORY_CARE_PROVIDER_SITE_OTHER): Payer: 59

## 2013-12-16 DIAGNOSIS — R7989 Other specified abnormal findings of blood chemistry: Secondary | ICD-10-CM

## 2013-12-16 DIAGNOSIS — R946 Abnormal results of thyroid function studies: Secondary | ICD-10-CM

## 2013-12-16 LAB — T4, FREE: Free T4: 0.97 ng/dL (ref 0.60–1.60)

## 2013-12-16 LAB — TSH: TSH: 0.81 u[IU]/mL (ref 0.35–4.50)

## 2013-12-17 ENCOUNTER — Encounter: Payer: Self-pay | Admitting: *Deleted

## 2014-01-15 ENCOUNTER — Encounter: Payer: Self-pay | Admitting: Internal Medicine

## 2014-01-15 ENCOUNTER — Ambulatory Visit (INDEPENDENT_AMBULATORY_CARE_PROVIDER_SITE_OTHER): Payer: 59 | Admitting: Internal Medicine

## 2014-01-15 ENCOUNTER — Other Ambulatory Visit (INDEPENDENT_AMBULATORY_CARE_PROVIDER_SITE_OTHER): Payer: 59

## 2014-01-15 VITALS — BP 130/90 | HR 82 | Temp 99.2°F | Resp 16 | Ht 67.0 in | Wt 187.1 lb

## 2014-01-15 DIAGNOSIS — R5081 Fever presenting with conditions classified elsewhere: Secondary | ICD-10-CM

## 2014-01-15 DIAGNOSIS — R109 Unspecified abdominal pain: Secondary | ICD-10-CM | POA: Diagnosis not present

## 2014-01-15 DIAGNOSIS — R509 Fever, unspecified: Secondary | ICD-10-CM

## 2014-01-15 DIAGNOSIS — R8290 Unspecified abnormal findings in urine: Secondary | ICD-10-CM

## 2014-01-15 LAB — CBC WITH DIFFERENTIAL/PLATELET
Basophils Absolute: 0 10*3/uL (ref 0.0–0.1)
Basophils Relative: 0.1 % (ref 0.0–3.0)
Eosinophils Absolute: 0.2 10*3/uL (ref 0.0–0.7)
Eosinophils Relative: 2.9 % (ref 0.0–5.0)
HCT: 34 % — ABNORMAL LOW (ref 36.0–46.0)
Hemoglobin: 11.1 g/dL — ABNORMAL LOW (ref 12.0–15.0)
Lymphocytes Relative: 25.1 % (ref 12.0–46.0)
Lymphs Abs: 1.9 10*3/uL (ref 0.7–4.0)
MCHC: 32.8 g/dL (ref 30.0–36.0)
MCV: 85.9 fl (ref 78.0–100.0)
Monocytes Absolute: 0.7 10*3/uL (ref 0.1–1.0)
Monocytes Relative: 8.9 % (ref 3.0–12.0)
Neutro Abs: 4.7 10*3/uL (ref 1.4–7.7)
Neutrophils Relative %: 63 % (ref 43.0–77.0)
Platelets: 557 10*3/uL — ABNORMAL HIGH (ref 150.0–400.0)
RBC: 3.96 Mil/uL (ref 3.87–5.11)
RDW: 13.3 % (ref 11.5–15.5)
WBC: 7.4 10*3/uL (ref 4.0–10.5)

## 2014-01-15 LAB — POCT URINALYSIS DIPSTICK
Bilirubin, UA: NEGATIVE
Blood, UA: 7.5
Glucose, UA: NEGATIVE
Ketones, UA: NEGATIVE
Leukocytes, UA: NEGATIVE
Nitrite, UA: NEGATIVE
Protein, UA: NEGATIVE
Spec Grav, UA: 1.01
Urobilinogen, UA: NEGATIVE
pH, UA: 5

## 2014-01-15 NOTE — Patient Instructions (Signed)
Your next office appointment will be determined based upon review of your pending labs . Those instructions will be transmitted to you through My Chart. Followup as needed for your acute issue. Please report any significant change in your symptoms. NSAIDS ( Aleve, Advil, Naproxen) or Tylenol every 4 hrs as needed for fever as discussed based on label recommendations 

## 2014-01-15 NOTE — Progress Notes (Signed)
Pre visit review using our clinic review tool, if applicable. No additional management support is needed unless otherwise documented below in the visit note. 

## 2014-01-16 NOTE — Progress Notes (Signed)
   Subjective:    Patient ID: Joann Davis, female    DOB: 03/18/1970, 44 y.o.   MRN: [id number]  HPI Symptoms began 01/11/14 as sore throat. She had diffuse myalgia type pain. Her fever was noted to be up to 104. It subsequently dropped to 103. She had  profound fatigue & slept for 24 hours.  She took nonsteroidal's every four hours with some resolution of temperature . On 1/4 she had chills and fever in the afternoon. On 1/5 she again had fever &  near syncope  She also has flank pain without associated genitourinary symptoms .  She did not take the flu shot this year.  PMH of "occasional " UTIs. Review of Systems   Frontal headache, facial pain , nasal purulence, dental pain, sore throat , otic pain or otic discharge denied. Dysuria, pyuria, hematuria, frequency, nocturia or polyuria are denied. No diarrhea or abdominal pain. No rash or tick exposure. There is no significant cough, sputum production, ,or  paroxysmal nocturnal dyspnea.      Objective:   Physical Exam Gen.: Healthy and well-nourished in appearance. Alert, appropriate and cooperative throughout exam. Appears younger than stated age  Head: Normocephalic without obvious abnormalities  Eyes: No corneal or conjunctival inflammation noted. Pupils equal round reactive to light and accommodation. Extraocular motion intact.  Ears: External  ear exam reveals no significant lesions or deformities. Canals clear .TMs normal. Hearing is grossly normal bilaterally. Nose: External nasal exam reveals no deformity or inflammation. Nasal mucosa are pink and moist. No lesions or exudates noted.   Mouth: Oral mucosa and oropharynx reveal no lesions or exudates. Teeth in good repair. Neck: No deformities, masses, or tenderness noted. Range of motion & Thyroid nbormal. Lungs: Normal respiratory effort; chest expands symmetrically. Lungs are clear to auscultation without rales, wheezes, or increased work of breathing. Heart: Normal rate  and rhythm. Normal S1 and S2. No gallop, click, or rub. No murmur. Abdomen: Bowel sounds normal; abdomen soft . Minimal tenderness RLQ. No masses, organomegaly or hernias noted.             Musculoskeletal/extremities: No deformity or scoliosis noted of  the thoracic or lumbar spine. No clubbing, cyanosis, edema, or significant extremity  deformity noted.  Range of motion normal . Tone & strength normal. Hand joints normal  Fingernail health good. Slight crepitus of knees  Able to lie down & sit up w/o help.  Negative SLR bilaterally, specifically no meningeal signs Vascular: Carotid, radial artery, dorsalis pedis and  posterior tibial pulses are full and equal. No bruits present. Neurologic: Alert and oriented x3. Deep tendon reflexes symmetrical and normal.  Gait normal     Skin: Intact without suspicious lesions or rashes. Lymph: No cervical, axillary lymphadenopathy present. Psych: Mood and affect are normal. Normally interactive                                                                                       Assessment & Plan:  #1 fever w/o localizing signs . Minor RLQ discomfort #2 flank pain #3 "cloudy" urine

## 2014-01-17 LAB — URINE CULTURE: Colony Count: 15000

## 2014-05-09 ENCOUNTER — Telehealth: Payer: Self-pay | Admitting: Family Medicine

## 2014-05-09 NOTE — Telephone Encounter (Signed)
Lab appt scheduled with pt 

## 2014-05-09 NOTE — Telephone Encounter (Signed)
Pt called stating she saw dr hopper in January he wanted her to have labs a couple weeks after that appointment.  Pt called to get labs done   No order is system  Is it ok to schedule???

## 2014-05-09 NOTE — Telephone Encounter (Signed)
Please schedule labs for anemia : cbc with diff/ferritin /iron level and B12 Thanks

## 2014-05-19 ENCOUNTER — Other Ambulatory Visit (INDEPENDENT_AMBULATORY_CARE_PROVIDER_SITE_OTHER): Payer: 59

## 2014-05-19 DIAGNOSIS — D649 Anemia, unspecified: Secondary | ICD-10-CM

## 2014-05-19 LAB — CBC WITH DIFFERENTIAL/PLATELET
Basophils Absolute: 0 10*3/uL (ref 0.0–0.1)
Basophils Relative: 0.5 % (ref 0.0–3.0)
Eosinophils Absolute: 0.1 10*3/uL (ref 0.0–0.7)
Eosinophils Relative: 1.3 % (ref 0.0–5.0)
HCT: 35.5 % — ABNORMAL LOW (ref 36.0–46.0)
Hemoglobin: 11.8 g/dL — ABNORMAL LOW (ref 12.0–15.0)
Lymphocytes Relative: 25.7 % (ref 12.0–46.0)
Lymphs Abs: 1.6 10*3/uL (ref 0.7–4.0)
MCHC: 33.1 g/dL (ref 30.0–36.0)
MCV: 86.4 fl (ref 78.0–100.0)
Monocytes Absolute: 0.6 10*3/uL (ref 0.1–1.0)
Monocytes Relative: 9.1 % (ref 3.0–12.0)
Neutro Abs: 3.9 10*3/uL (ref 1.4–7.7)
Neutrophils Relative %: 63.4 % (ref 43.0–77.0)
Platelets: 495 10*3/uL — ABNORMAL HIGH (ref 150.0–400.0)
RBC: 4.11 Mil/uL (ref 3.87–5.11)
RDW: 14.4 % (ref 11.5–15.5)
WBC: 6.2 10*3/uL (ref 4.0–10.5)

## 2014-05-19 LAB — IRON: Iron: 59 ug/dL (ref 42–145)

## 2014-05-19 LAB — FERRITIN: Ferritin: 12 ng/mL (ref 10.0–291.0)

## 2014-05-19 LAB — VITAMIN B12: Vitamin B-12: 355 pg/mL (ref 211–911)

## 2014-05-20 ENCOUNTER — Encounter: Payer: Self-pay | Admitting: *Deleted

## 2014-05-22 ENCOUNTER — Other Ambulatory Visit: Payer: Self-pay | Admitting: *Deleted

## 2014-05-22 MED ORDER — PANTOPRAZOLE SODIUM 40 MG PO TBEC: DELAYED_RELEASE_TABLET | ORAL | Status: DC

## 2014-05-25 LAB — HM MAMMOGRAPHY: HM Mammogram: NORMAL

## 2014-07-15 ENCOUNTER — Other Ambulatory Visit: Payer: Self-pay | Admitting: Family Medicine

## 2014-08-29 ENCOUNTER — Other Ambulatory Visit: Payer: Self-pay | Admitting: Family Medicine

## 2014-08-29 ENCOUNTER — Other Ambulatory Visit: Payer: Self-pay

## 2014-08-29 MED ORDER — PANTOPRAZOLE SODIUM 40 MG PO TBEC: DELAYED_RELEASE_TABLET | ORAL | Status: DC

## 2014-08-29 NOTE — Telephone Encounter (Signed)
Pt request # 15 pantoprazole to [facility] while waiting on mail order delivery. Advised pt done and pt will cb to schedule appt . Last annual 08/2013.

## 2014-10-22 ENCOUNTER — Other Ambulatory Visit: Payer: Self-pay | Admitting: Family Medicine

## 2015-02-08 ENCOUNTER — Ambulatory Visit (INDEPENDENT_AMBULATORY_CARE_PROVIDER_SITE_OTHER): Payer: 59 | Admitting: Urgent Care

## 2015-02-08 ENCOUNTER — Encounter: Payer: Self-pay | Admitting: Urgent Care

## 2015-02-08 VITALS — BP 124/72 | HR 70 | Temp 99.0°F | Resp 16 | Ht 66.5 in | Wt 183.0 lb

## 2015-02-08 DIAGNOSIS — N3090 Cystitis, unspecified without hematuria: Secondary | ICD-10-CM | POA: Diagnosis not present

## 2015-02-08 DIAGNOSIS — R350 Frequency of micturition: Secondary | ICD-10-CM | POA: Diagnosis not present

## 2015-02-08 LAB — POCT UA - MICROSCOPIC ONLY

## 2015-02-08 LAB — POCT URINALYSIS DIPSTICK
Bilirubin, UA: NEGATIVE
Glucose, UA: NEGATIVE
Ketones, UA: NEGATIVE
Nitrite, UA: NEGATIVE
Protein, UA: NEGATIVE
Spec Grav, UA: <=1.005
Urobilinogen, UA: 0.2
pH, UA: 5.5

## 2015-02-08 MED ORDER — CIPROFLOXACIN HCL 500 MG PO TABS: 500.0000 mg | ORAL_TABLET | Freq: Two times a day (BID) | ORAL | Status: DC

## 2015-02-08 NOTE — Progress Notes (Signed)
    MRN: [id number] DOB: 05-21-1970  Subjective:   Joann Davis is a 45 y.o. female presenting for chief complaint of Urinary Frequency  Reports 1 day history of dysuria, urinary frequency and cloudy malordorous urine, nausea. Patient is currently on her cycle. Has tried drinking Cranberry juice with minimal relief. Of note, patient has had UTIs every 1-2 years. Denies fever, hematuria, flank pain, abdominal pain, pelvic pain, genital rash, genital irritation and vaginal discharge, vomiting and abdominal pain. Denies smoking cigarettes, drinks 1 alcohol drink per week.    Joann Davis has a current medication list which includes the following prescription(s): pantoprazole. Patient is allergic to monistat and tramadol.  Joann Davis  has a past medical history of GERD (gastroesophageal reflux disease); Pre-eclampsia; Elevated blood pressure reading without diagnosis of hypertension; Migraine with aura, without mention of intractable migraine without mention of status migrainosus; and Temporomandibular joint disorders, unspecified. Also  has no past surgical history on file.  Her family history includes Breast cancer in her maternal grandmother; Colon cancer in her maternal aunt and maternal uncle; Diabetes in her paternal grandfather; Diabetes type II in her father; Hypertension in her mother; Stroke (age of onset: 47) in her father and paternal grandmother.  Objective:   Vitals: BP 124/72 mmHg  Pulse 70  Temp(Src) 99 F (37.2 C)  Resp 16  Ht 5' 6.5" (1.689 m)  Wt 183 lb (83.008 kg)  BMI 29.10 kg/m2  SpO2 98%  LMP 02/08/2015 (Exact Date)  Physical Exam  Constitutional: She is oriented to person, place, and time. She appears well-developed and well-nourished.  Cardiovascular: Normal rate, regular rhythm and intact distal pulses.  Exam reveals no gallop and no friction rub.   No murmur heard. Pulmonary/Chest: No respiratory distress. She has no wheezes. She has no rales.  Abdominal:  No CVA  tenderness.  Musculoskeletal: She exhibits no edema.  Neurological: She is alert and oriented to person, place, and time.  Skin: Skin is warm and dry.   Results for orders placed or performed in visit on 02/08/15 (from the past 24 hour(s))  POCT urinalysis dipstick     Status: Abnormal   Collection Time: 02/08/15 10:27 AM  Result Value Ref Range   Color, UA yellow    Clarity, UA cloudy    Glucose, UA neg    Bilirubin, UA neg    Ketones, UA neg    Spec Grav, UA <=1.005    Blood, UA large    pH, UA 5.5    Protein, UA neg    Urobilinogen, UA 0.2    Nitrite, UA neg    Leukocytes, UA small (1+) (A) Negative  POCT UA - Microscopic Only     Status: None   Collection Time: 02/08/15 10:27 AM  Result Value Ref Range   WBC, Ur, HPF, POC too many    RBC, urine, microscopic too many    Bacteria, U Microscopic mod    Mucus, UA present    Epithelial cells, urine per micros few    Crystals, Ur, HPF, POC none    Casts, Ur, LPF, POC none    Yeast, UA none    Assessment and Plan :   1. Cystitis 2. Urinary frequency - Urine culture pending, start cipro, advised aggressive hydration. RTC if no improvement or worsening symptoms in 1 week.   Wallis Bamberg, PA-C [facility] [phone] 02/08/2015 10:48 AM

## 2015-02-08 NOTE — Patient Instructions (Signed)

## 2015-02-10 LAB — URINE CULTURE: Colony Count: >=100000

## 2015-02-12 ENCOUNTER — Encounter: Payer: Self-pay | Admitting: Urgent Care

## 2015-03-22 ENCOUNTER — Telehealth: Payer: Self-pay | Admitting: Family Medicine

## 2015-03-22 DIAGNOSIS — Z0000 Encounter for general adult medical examination without abnormal findings: Secondary | ICD-10-CM

## 2015-03-22 NOTE — Telephone Encounter (Signed)
-----   Message from Baldomero LamyNatasha C Chavers sent at 03/19/2015  1:53 PM EST ----- Regarding: Cpx labs 3/17, need orders. Thanks! :-) Please order  future cpx labs for pt's upcoming lab appt. Thanks Rodney Boozeasha

## 2015-03-27 ENCOUNTER — Other Ambulatory Visit (INDEPENDENT_AMBULATORY_CARE_PROVIDER_SITE_OTHER): Payer: 59

## 2015-03-27 DIAGNOSIS — Z0000 Encounter for general adult medical examination without abnormal findings: Secondary | ICD-10-CM | POA: Diagnosis not present

## 2015-03-27 LAB — CBC WITH DIFFERENTIAL/PLATELET
Basophils Absolute: 0 10*3/uL (ref 0.0–0.1)
Basophils Relative: 0.3 % (ref 0.0–3.0)
Eosinophils Absolute: 0.1 10*3/uL (ref 0.0–0.7)
Eosinophils Relative: 1 % (ref 0.0–5.0)
HCT: 40.6 % (ref 36.0–46.0)
Hemoglobin: 13.4 g/dL (ref 12.0–15.0)
Lymphocytes Relative: 20.9 % (ref 12.0–46.0)
Lymphs Abs: 1.3 10*3/uL (ref 0.7–4.0)
MCHC: 32.9 g/dL (ref 30.0–36.0)
MCV: 88.4 fl (ref 78.0–100.0)
Monocytes Absolute: 0.6 10*3/uL (ref 0.1–1.0)
Monocytes Relative: 10.2 % (ref 3.0–12.0)
Neutro Abs: 4 10*3/uL (ref 1.4–7.7)
Neutrophils Relative %: 67.6 % (ref 43.0–77.0)
Platelets: 445 10*3/uL — ABNORMAL HIGH (ref 150.0–400.0)
RBC: 4.59 Mil/uL (ref 3.87–5.11)
RDW: 12.8 % (ref 11.5–15.5)
WBC: 6 10*3/uL (ref 4.0–10.5)

## 2015-03-27 LAB — COMPREHENSIVE METABOLIC PANEL
ALT: 22 U/L (ref 0–35)
AST: 16 U/L (ref 0–37)
Albumin: 4.1 g/dL (ref 3.5–5.2)
Alkaline Phosphatase: 53 U/L (ref 39–117)
BUN: 6 mg/dL (ref 6–23)
CO2: 28 mEq/L (ref 19–32)
Calcium: 9.4 mg/dL (ref 8.4–10.5)
Chloride: 105 mEq/L (ref 96–112)
Creatinine, Ser: 0.6 mg/dL (ref 0.40–1.20)
GFR: 114.75 mL/min (ref >60.00)
Glucose, Bld: 94 mg/dL (ref 70–99)
Potassium: 3.9 mEq/L (ref 3.5–5.1)
Sodium: 140 mEq/L (ref 135–145)
Total Bilirubin: 0.5 mg/dL (ref 0.2–1.2)
Total Protein: 7 g/dL (ref 6.0–8.3)

## 2015-03-27 LAB — LIPID PANEL
Cholesterol: 148 mg/dL (ref 0–200)
HDL: 34.5 mg/dL — ABNORMAL LOW (ref >39.00)
LDL Cholesterol: 100 mg/dL — ABNORMAL HIGH (ref 0–99)
NonHDL: 113.81
Total CHOL/HDL Ratio: 4
Triglycerides: 68 mg/dL (ref 0.0–149.0)
VLDL: 13.6 mg/dL (ref 0.0–40.0)

## 2015-03-27 LAB — TSH: TSH: 0.46 u[IU]/mL (ref 0.35–4.50)

## 2015-04-01 ENCOUNTER — Ambulatory Visit (INDEPENDENT_AMBULATORY_CARE_PROVIDER_SITE_OTHER): Payer: 59 | Admitting: Family Medicine

## 2015-04-01 ENCOUNTER — Encounter: Payer: Self-pay | Admitting: Family Medicine

## 2015-04-01 VITALS — BP 112/64 | HR 56 | Temp 98.0°F | Ht 66.5 in | Wt 173.5 lb

## 2015-04-01 DIAGNOSIS — Z0000 Encounter for general adult medical examination without abnormal findings: Secondary | ICD-10-CM | POA: Diagnosis not present

## 2015-04-01 DIAGNOSIS — Z23 Encounter for immunization: Secondary | ICD-10-CM | POA: Diagnosis not present

## 2015-04-01 DIAGNOSIS — K219 Gastro-esophageal reflux disease without esophagitis: Secondary | ICD-10-CM

## 2015-04-01 MED ORDER — PANTOPRAZOLE SODIUM 40 MG PO TBEC: DELAYED_RELEASE_TABLET | ORAL | Status: DC

## 2015-04-01 NOTE — Assessment & Plan Note (Signed)
Reviewed health habits including diet and exercise and skin cancer prevention Reviewed appropriate screening tests for age  Also reviewed health mt list, fam hx and immunization status , as well as social and family history   See HPI Labs reviewed  Platelets are baseline for her -mildly high/no symptoms Declines HIV test-not high risk Flu shot today  Don't forget to get your mammogram and gyn exam as planned  Take care of yourself  Cholesterol is improved (Avoid red meat/ fried foods/ egg yolks/ fatty breakfast meats/ butter, cheese and high fat dairy/ and shellfish) Make sure to drink enough water

## 2015-04-01 NOTE — Patient Instructions (Signed)
Flu shot today  Don't forget to get your mammogram and gyn exam as planned  Take care of yourself  Cholesterol is improved (Avoid red meat/ fried foods/ egg yolks/ fatty breakfast meats/ butter, cheese and high fat dairy/ and shellfish) Make sure to drink enough water

## 2015-04-01 NOTE — Progress Notes (Signed)
Subjective:    Patient ID: Joann Davis, female    DOB: [birth year], 45 y.o.   MRN: [id number]  HPI Here for health maintenance exam and to review chronic medical problems    Has been doing well   Wt is down 10 lb with bmi of 27 Some of it was from a stomach bug  Gave up sweets and fried food for lent  Exercise - walks on elliptical for 20 min at work/ lunch / also stands to work when she can   HIV screening- declines /not high risk   Flu shot - did not get (missed it at work) Wants one today   Pap 3/14 negative/normal  She started doing them every 3 years - is due in summer with gyn  Has not had abnormal paps Periods are pretty regular/not troublesome  husb had a vasectomy - does not need contraception    Mm 5/16 - will be due in May ( does it at gyn)  Self breast exam -no lumps/changes   Results for orders placed or performed in visit on 03/27/15  CBC with Differential/Platelet  Result Value Ref Range   WBC 6.0 4.0 - 10.5 K/uL   RBC 4.59 3.87 - 5.11 Mil/uL   Hemoglobin 13.4 12.0 - 15.0 g/dL   HCT 40.9 81.1 - 91.4 %   MCV 88.4 78.0 - 100.0 fl   MCHC 32.9 30.0 - 36.0 g/dL   RDW 78.2 95.6 - 21.3 %   Platelets 445.0 (H) 150.0 - 400.0 K/uL   Neutrophils Relative % 67.6 43.0 - 77.0 %   Lymphocytes Relative 20.9 12.0 - 46.0 %   Monocytes Relative 10.2 3.0 - 12.0 %   Eosinophils Relative 1.0 0.0 - 5.0 %   Basophils Relative 0.3 0.0 - 3.0 %   Neutro Abs 4.0 1.4 - 7.7 K/uL   Lymphs Abs 1.3 0.7 - 4.0 K/uL   Monocytes Absolute 0.6 0.1 - 1.0 K/uL   Eosinophils Absolute 0.1 0.0 - 0.7 K/uL   Basophils Absolute 0.0 0.0 - 0.1 K/uL  Comprehensive metabolic panel  Result Value Ref Range   Sodium 140 135 - 145 mEq/L   Potassium 3.9 3.5 - 5.1 mEq/L   Chloride 105 96 - 112 mEq/L   CO2 28 19 - 32 mEq/L   Glucose, Bld 94 70 - 99 mg/dL   BUN 6 6 - 23 mg/dL   Creatinine, Ser 0.86 0.40 - 1.20 mg/dL   Total Bilirubin 0.5 0.2 - 1.2 mg/dL   Alkaline Phosphatase 53 39 - 117 U/L   AST 16 0 - 37 U/L   ALT 22 0 - 35 U/L   Total Protein 7.0 6.0 - 8.3 g/dL   Albumin 4.1 3.5 - 5.2 g/dL   Calcium 9.4 8.4 - 57.8 mg/dL   GFR 469.62 >95.28 mL/min  Lipid panel  Result Value Ref Range   Cholesterol 148 0 - 200 mg/dL   Triglycerides 41.3 0.0 - 149.0 mg/dL   HDL 24.40 (L) >10.27 mg/dL   VLDL 25.3 0.0 - 66.4 mg/dL   LDL Cholesterol 403 (H) 0 - 99 mg/dL   Total CHOL/HDL Ratio 4    NonHDL 113.81   TSH  Result Value Ref Range   TSH 0.46 0.35 - 4.50 uIU/mL      Cholesterol Lab Results  Component Value Date   CHOL 148 03/27/2015   CHOL 191 08/27/2013   CHOL 173 11/21/2011   Lab Results  Component Value Date   HDL 34.50*  03/27/2015   HDL 40.40 08/27/2013   HDL 39.10 11/21/2011   Lab Results  Component Value Date   LDLCALC 100* 03/27/2015   LDLCALC 133* 08/27/2013   LDLCALC 123* 11/21/2011   Lab Results  Component Value Date   TRIG 68.0 03/27/2015   TRIG 89.0 08/27/2013   TRIG 55.0 11/21/2011   Lab Results  Component Value Date   CHOLHDL 4 03/27/2015   CHOLHDL 5 08/27/2013   CHOLHDL 4 11/21/2011   No results found for: LDLDIRECT  Platelets are baseline mildly high  Eating better -no fried foods or sweets  Not as much exercise this week- due to a stomach bug   Patient Active Problem List   Diagnosis Date Noted  . Routine general medical examination at a health care facility 11/20/2011  . MIGRAINE WITH AURA 02/11/2009  . ELEVATED BLOOD PRESSURE 10/03/2008  . GERD 10/24/2006   Past Medical History  Diagnosis Date  . GERD (gastroesophageal reflux disease)   . Pre-eclampsia   . Elevated blood pressure reading without diagnosis of hypertension   . Migraine with aura, without mention of intractable migraine without mention of status migrainosus   . Temporomandibular joint disorders, unspecified    No past surgical history on file. Social History  Substance Use Topics  . Smoking status: Never Smoker   . Smokeless tobacco: None  . Alcohol Use:  0.0 oz/week    0 Standard drinks or equivalent per week     Comment: Occasionally (light)   Family History  Problem Relation Age of Onset  . Stroke Father 39    x 2  . Diabetes type II Father   . Hypertension Mother   . Stroke Paternal Grandmother 2039  . Diabetes Paternal Grandfather   . Breast cancer Maternal Grandmother   . Colon cancer Maternal Aunt   . Colon cancer Maternal Uncle    Allergies  Allergen Reactions  . Monistat [Miconazole]   . Tramadol     Nausea and vomiting   Current Outpatient Prescriptions on File Prior to Visit  Medication Sig Dispense Refill  . pantoprazole (PROTONIX) 40 MG tablet Take 1 tablet by mouth  daily 90 tablet 1   No current facility-administered medications on file prior to visit.     Review of Systems Review of Systems  Constitutional: Negative for fever, appetite change, fatigue and unexpected weight change.  Eyes: Negative for pain and visual disturbance.  Respiratory: Negative for cough and shortness of breath.   Cardiovascular: Negative for cp or palpitations    Gastrointestinal: Negative for nausea, diarrhea and constipation.  Genitourinary: Negative for urgency and frequency.  Skin: Negative for pallor or rash   Neurological: Negative for weakness, light-headedness, numbness and headaches.  Hematological: Negative for adenopathy. Does not bruise/bleed easily.  Psychiatric/Behavioral: Negative for dysphoric mood. The patient is not nervous/anxious.         Objective:   Physical Exam  Constitutional: She appears well-developed and well-nourished. No distress.  overwt and well appearing  HENT:  Head: Normocephalic and atraumatic.  Right Ear: External ear normal.  Left Ear: External ear normal.  Nose: Nose normal.  Mouth/Throat: Oropharynx is clear and moist.  Eyes: Conjunctivae and EOM are normal. Pupils are equal, round, and reactive to light. Right eye exhibits no discharge. Left eye exhibits no discharge. No scleral  icterus.  Neck: Normal range of motion. Neck supple. No JVD present. Carotid bruit is not present. No thyromegaly present.  Cardiovascular: Normal rate, regular rhythm, normal heart sounds and  intact distal pulses.  Exam reveals no gallop.   Pulmonary/Chest: Effort normal and breath sounds normal. No respiratory distress. She has no wheezes. She has no rales.  Abdominal: Soft. Bowel sounds are normal. She exhibits no distension and no mass. There is no tenderness.  Musculoskeletal: She exhibits no edema or tenderness.  Lymphadenopathy:    She has no cervical adenopathy.  Neurological: She is alert. She has normal reflexes. No cranial nerve deficit. She exhibits normal muscle tone. Coordination normal.  Skin: Skin is warm and dry. No rash noted. No erythema. No pallor.  Solar lentigines diffusely Fair complexion  Psychiatric: She has a normal mood and affect.          Assessment & Plan:   Problem List Items Addressed This Visit      Digestive   GERD    Refilled protonix  Has rebound symptoms if she stops it , doing well and also watching diet       Relevant Medications   pantoprazole (PROTONIX) 40 MG tablet     Other   Routine general medical examination at a health care facility - Primary    Reviewed health habits including diet and exercise and skin cancer prevention Reviewed appropriate screening tests for age  Also reviewed health mt list, fam hx and immunization status , as well as social and family history   See HPI Labs reviewed  Platelets are baseline for her -mildly high/no symptoms Declines HIV test-not high risk Flu shot today  Don't forget to get your mammogram and gyn exam as planned  Take care of yourself  Cholesterol is improved (Avoid red meat/ fried foods/ egg yolks/ fatty breakfast meats/ butter, cheese and high fat dairy/ and shellfish) Make sure to drink enough water        Other Visit Diagnoses    Need for influenza vaccination        Relevant  Orders    Flu Vaccine QUAD 36+ mos PF IM (Fluarix & Fluzone Quad PF) (Completed)

## 2015-04-01 NOTE — Assessment & Plan Note (Signed)
Refilled protonix  Has rebound symptoms if she stops it , doing well and also watching diet

## 2015-04-01 NOTE — Progress Notes (Signed)
Pre visit review using our clinic review tool, if applicable. No additional management support is needed unless otherwise documented below in the visit note. 

## 2015-06-25 ENCOUNTER — Ambulatory Visit (INDEPENDENT_AMBULATORY_CARE_PROVIDER_SITE_OTHER): Payer: 59 | Admitting: Internal Medicine

## 2015-06-25 ENCOUNTER — Encounter: Payer: Self-pay | Admitting: Internal Medicine

## 2015-06-25 VITALS — BP 120/78 | HR 71 | Temp 98.6°F | Wt 175.0 lb

## 2015-06-25 DIAGNOSIS — J069 Acute upper respiratory infection, unspecified: Secondary | ICD-10-CM | POA: Diagnosis not present

## 2015-06-25 MED ORDER — HYDROCODONE-HOMATROPINE 5-1.5 MG/5ML PO SYRP: 5.0000 mL | ORAL_SOLUTION | Freq: Three times a day (TID) | ORAL | Status: DC | PRN

## 2015-06-25 MED ORDER — AZITHROMYCIN 250 MG PO TABS
ORAL_TABLET | ORAL | Status: DC
Start: 2015-06-25 — End: 2016-01-01

## 2015-06-25 NOTE — Progress Notes (Signed)
Pre visit review using our clinic review tool, if applicable. No additional management support is needed unless otherwise documented below in the visit note. 

## 2015-06-25 NOTE — Patient Instructions (Signed)
Upper Respiratory Infection, Adult Most upper respiratory infections (URIs) are a viral infection of the air passages leading to the lungs. A URI affects the nose, throat, and upper air passages. The most common type of URI is nasopharyngitis and is typically referred to as "the common cold." URIs run their course and usually go away on their own. Most of the time, a URI does not require medical attention, but sometimes a bacterial infection in the upper airways can follow a viral infection. This is called a secondary infection. Sinus and middle ear infections are common types of secondary upper respiratory infections. Bacterial pneumonia can also complicate a URI. A URI can worsen asthma and chronic obstructive pulmonary disease (COPD). Sometimes, these complications can require emergency medical care and may be life threatening.  CAUSES Almost all URIs are caused by viruses. A virus is a type of germ and can spread from one person to another.  RISKS FACTORS You may be at risk for a URI if:   You smoke.   You have chronic heart or lung disease.  You have a weakened defense (immune) system.   You are very young or very old.   You have nasal allergies or asthma.  You work in crowded or poorly ventilated areas.  You work in health care facilities or schools. SIGNS AND SYMPTOMS  Symptoms typically develop 2-3 days after you come in contact with a cold virus. Most viral URIs last 7-10 days. However, viral URIs from the influenza virus (flu virus) can last 14-18 days and are typically more severe. Symptoms may include:   Runny or stuffy (congested) nose.   Sneezing.   Cough.   Sore throat.   Headache.   Fatigue.   Fever.   Loss of appetite.   Pain in your forehead, behind your eyes, and over your cheekbones (sinus pain).  Muscle aches.  DIAGNOSIS  Your health care provider may diagnose a URI by:  Physical exam.  Tests to check that your symptoms are not due to  another condition such as:  Strep throat.  Sinusitis.  Pneumonia.  Asthma. TREATMENT  A URI goes away on its own with time. It cannot be cured with medicines, but medicines may be prescribed or recommended to relieve symptoms. Medicines may help:  Reduce your fever.  Reduce your cough.  Relieve nasal congestion. HOME CARE INSTRUCTIONS   Take medicines only as directed by your health care provider.   Gargle warm saltwater or take cough drops to comfort your throat as directed by your health care provider.  Use a warm mist humidifier or inhale steam from a shower to increase air moisture. This may make it easier to breathe.  Drink enough fluid to keep your urine clear or pale yellow.   Eat soups and other clear broths and maintain good nutrition.   Rest as needed.   Return to work when your temperature has returned to normal or as your health care provider advises. You may need to stay home longer to avoid infecting others. You can also use a face mask and careful hand washing to prevent spread of the virus.  Increase the usage of your inhaler if you have asthma.   Do not use any tobacco products, including cigarettes, chewing tobacco, or electronic cigarettes. If you need help quitting, ask your health care provider. PREVENTION  The best way to protect yourself from getting a cold is to practice good hygiene.   Avoid oral or hand contact with people with cold   symptoms.   Wash your hands often if contact occurs.  There is no clear evidence that vitamin C, vitamin E, echinacea, or exercise reduces the chance of developing a cold. However, it is always recommended to get plenty of rest, exercise, and practice good nutrition.  SEEK MEDICAL CARE IF:   You are getting worse rather than better.   Your symptoms are not controlled by medicine.   You have chills.  You have worsening shortness of breath.  You have brown or red mucus.  You have yellow or brown nasal  discharge.  You have pain in your face, especially when you bend forward.  You have a fever.  You have swollen neck glands.  You have pain while swallowing.  You have white areas in the back of your throat. SEEK IMMEDIATE MEDICAL CARE IF:   You have severe or persistent:  Headache.  Ear pain.  Sinus pain.  Chest pain.  You have chronic lung disease and any of the following:  Wheezing.  Prolonged cough.  Coughing up blood.  A change in your usual mucus.  You have a stiff neck.  You have changes in your:  Vision.  Hearing.  Thinking.  Mood. MAKE SURE YOU:   Understand these instructions.  Will watch your condition.  Will get help right away if you are not doing well or get worse.   This information is not intended to replace advice given to you by your health care provider. Make sure you discuss any questions you have with your health care provider.   Document Released: 06/22/2000 Document Revised: 05/13/2014 Document Reviewed: 04/03/2013 Elsevier Interactive Patient Education 2016 [employer]  

## 2015-06-25 NOTE — Progress Notes (Signed)
HPI  Pt presents to the clinic today with c/o runny nose and cough. This started 5 days ago. She is blowing clear mucous out of her nose. The cough is productive of green mucous. She denies fever, chills or body aches. She has taken Mucinex without any relief. She has no history of allergies or breathing problems. She has not had sick contacts that she is aware of.  Review of Systems      Past Medical History  Diagnosis Date  . GERD (gastroesophageal reflux disease)   . Pre-eclampsia   . Elevated blood pressure reading without diagnosis of hypertension   . Migraine with aura, without mention of intractable migraine without mention of status migrainosus   . Temporomandibular joint disorders, unspecified     Family History  Problem Relation Age of Onset  . Stroke Father 39    x 2  . Diabetes type II Father   . Hypertension Mother   . Stroke Paternal Grandmother [id number]  . Diabetes Paternal Grandfather   . Breast cancer Maternal Grandmother   . Colon cancer Maternal Aunt   . Colon cancer Maternal Uncle     Social History   Social History  . Marital Status: Married    Spouse Name: N/A  . Number of Children: 3  . Years of Education: N/A   Occupational History  . [employer] Unemployed   Social History Main Topics  . Smoking status: Never Smoker   . Smokeless tobacco: Not on file  . Alcohol Use: 0.0 oz/week    0 Standard drinks or equivalent per week     Comment: Occasionally (light)  . Drug Use: No  . Sexual Activity: Not on file   Other Topics Concern  . Not on file   Social History Narrative   Married; 3 children (2 girls, 1 boy)      Works for [employer], night shift      English degree ([employer]); EMT course    Allergies  Allergen Reactions  . Monistat [Miconazole]   . Tramadol     Nausea and vomiting     Constitutional: Positive headache, fatigue . Denies fever or abrupt weight changes.  HEENT:  Positive runny nose, sore throat. Denies eye redness,  eye pain, pressure behind the eyes, facial pain, nasal congestion, ear pain, ringing in the ears, wax buildup, or bloody nose. Respiratory: Positive cough. Denies difficulty breathing or shortness of breath.  Cardiovascular: Denies chest pain, chest tightness, palpitations or swelling in the hands or feet.   No other specific complaints in a complete review of systems (except as listed in HPI above).  Objective:   BP 120/78 mmHg  Pulse 71  Temp(Src) 98.6 F (37 C) (Oral)  Wt 175 lb (79.379 kg)  SpO2 99% Wt Readings from Last 3 Encounters:  06/25/15 175 lb (79.379 kg)  04/01/15 173 lb 8 oz (78.699 kg)  02/08/15 183 lb (83.008 kg)     General: Appears his stated age, ill appearing in NAD. HEENT: Head: normal shape and size, no sinus tenderness noted; Eyes: sclera white, no icterus, conjunctiva pink; Right Ear: cerumen impaction; Left Ear: Tm's gray and intact, normal light reflex; Nose: mucosa boggy and moist, septum midline; Throat/Mouth: Teeth present, mucosa erythematous and moist, no exudate noted, no lesions or ulcerations noted.  Neck: Cervical lymphadenopathy, L>R  Cardiovascular: Normal rate and rhythm. S1,S2 noted.  No murmur, rubs or gallops noted.  Pulmonary/Chest: Normal effort and positive vesicular breath sounds. No respiratory distress. No wheezes,  rales or ronchi noted.      Assessment & Plan:   Upper Respiratory Infection:  Get some rest and drink plenty of water Do salt water gargles for the sore throat eRx for Azithromax x 5 days Rx for Hycodan cough syrup Flonase 1 spray each nostril daily x 3 days then as needed  RTC as needed or if symptoms persist.  Nicki Reaper, NP

## 2016-01-01 ENCOUNTER — Encounter: Payer: Self-pay | Admitting: Internal Medicine

## 2016-01-01 ENCOUNTER — Ambulatory Visit (INDEPENDENT_AMBULATORY_CARE_PROVIDER_SITE_OTHER): Payer: 59 | Admitting: Internal Medicine

## 2016-01-01 VITALS — BP 116/82 | HR 65 | Temp 97.8°F | Wt 176.0 lb

## 2016-01-01 DIAGNOSIS — L309 Dermatitis, unspecified: Secondary | ICD-10-CM

## 2016-01-01 DIAGNOSIS — Z23 Encounter for immunization: Secondary | ICD-10-CM | POA: Diagnosis not present

## 2016-01-01 DIAGNOSIS — M25511 Pain in right shoulder: Secondary | ICD-10-CM

## 2016-01-01 MED ORDER — TRIAMCINOLONE ACETONIDE 0.5 % EX OINT: 1.0000 "application " | TOPICAL_OINTMENT | Freq: Two times a day (BID) | CUTANEOUS | 0 refills | Status: DC

## 2016-01-01 MED ORDER — PREDNISONE 10 MG PO TABS: ORAL_TABLET | ORAL | 0 refills | Status: DC

## 2016-01-01 NOTE — Patient Instructions (Signed)
Shoulder Exercises Ask your health care provider which exercises are safe for you. Do exercises exactly as told by your health care provider and adjust them as directed. It is normal to feel mild stretching, pulling, tightness, or discomfort as you do these exercises, but you should stop right away if you feel sudden pain or your pain gets worse.Do not begin these exercises until told by your health care provider. RANGE OF MOTION EXERCISES  These exercises warm up your muscles and joints and improve the movement and flexibility of your shoulder. These exercises also help to relieve pain, numbness, and tingling. These exercises involve stretching your injured shoulder directly. Exercise A: Pendulum   1. Stand near a wall or a surface that you can hold onto for balance. 2. Bend at the waist and let your left / right arm hang straight down. Use your other arm to support you. Keep your back straight and do not lock your knees. 3. Relax your left / right arm and shoulder muscles, and move your hips and your trunk so your left / right arm swings freely. Your arm should swing because of the motion of your body, not because you are using your arm or shoulder muscles. 4. Keep moving your body so your arm swings in the following directions, as told by your health care provider:  Side to side.  Forward and backward.  In clockwise and counterclockwise circles. 5. Continue each motion for __________ seconds, or for as long as told by your health care provider. 6. Slowly return to the starting position. Repeat __________ times. Complete this exercise __________ times a day. Exercise B:Flexion, Standing   1. Stand and hold a broomstick, a cane, or a similar object. Place your hands a [place] more than shoulder-width apart on the object. Your left / right hand should be palm-up, and your other hand should be palm-down. 2. Keep your elbow straight and keep your shoulder muscles relaxed. Push the stick down  with your healthy arm to raise your left / right arm in front of your body, and then over your head until you feel a stretch in your shoulder.  Avoid shrugging your shoulder while you raise your arm. Keep your shoulder blade tucked down toward the middle of your back. 3. Hold for __________ seconds. 4. Slowly return to the starting position. Repeat __________ times. Complete this exercise __________ times a day. Exercise C: Abduction, Standing  1. Stand and hold a broomstick, a cane, or a similar object. Place your hands a [place] more than shoulder-width apart on the object. Your left / right hand should be palm-up, and your other hand should be palm-down. 2. While keeping your elbow straight and your shoulder muscles relaxed, push the stick across your body toward your left / right side. Raise your left / right arm to the side of your body and then over your head until you feel a stretch in your shoulder.  Do not raise your arm above shoulder height, unless your health care provider tells you to do that.  Avoid shrugging your shoulder while you raise your arm. Keep your shoulder blade tucked down toward the middle of your back. 3. Hold for __________ seconds. 4. Slowly return to the starting position. Repeat __________ times. Complete this exercise __________ times a day. Exercise D:Internal Rotation   1. Place your left / right hand behind your back, palm-up. 2. Use your other hand to dangle an exercise band, a towel, or a similar object over your   shoulder. Grasp the band with your left / right hand so you are holding onto both ends. 3. Gently pull up on the band until you feel a stretch in the front of your left / right shoulder.  Avoid shrugging your shoulder while you raise your arm. Keep your shoulder blade tucked down toward the middle of your back. 4. Hold for __________ seconds. 5. Release the stretch by letting go of the band and lowering your hands. Repeat __________ times.  Complete this exercise __________ times a day. STRETCHING EXERCISES  These exercises warm up your muscles and joints and improve the movement and flexibility of your shoulder. These exercises also help to relieve pain, numbness, and tingling. These exercises are done using your healthy shoulder to help stretch the muscles of your injured shoulder. Exercise E: Corner Stretch (External Rotation and Abduction)   1. Stand in a doorway with one of your feet slightly in front of the other. This is called a staggered stance. If you cannot reach your forearms to the door frame, stand facing a corner of a room. 2. Choose one of the following positions as told by your health care provider:  Place your hands and forearms on the door frame above your head.  Place your hands and forearms on the door frame at the height of your head.  Place your hands on the door frame at the height of your elbows. 3. Slowly move your weight onto your front foot until you feel a stretch across your chest and in the front of your shoulders. Keep your head and chest upright and keep your abdominal muscles tight. 4. Hold for __________ seconds. 5. To release the stretch, shift your weight to your back foot. Repeat __________ times. Complete this stretch __________ times a day. Exercise F:Extension, Standing  1. Stand and hold a broomstick, a cane, or a similar object behind your back.  Your hands should be a [place] wider than shoulder-width apart.  Your palms should face away from your back. 2. Keeping your elbows straight and keeping your shoulder muscles relaxed, move the stick away from your body until you feel a stretch in your shoulder.  Avoid shrugging your shoulders while you move the stick. Keep your shoulder blade tucked down toward the middle of your back. 3. Hold for __________ seconds. 4. Slowly return to the starting position. Repeat __________ times. Complete this exercise __________ times a  day. STRENGTHENING EXERCISES  These exercises build strength and endurance in your shoulder. Endurance is the ability to use your muscles for a long time, even after they get tired. Exercise G:External Rotation   1. Sit in a stable chair without armrests. 2. Secure an exercise band at elbow height on your left / right side. 3. Place a soft object, such as a folded towel or a small pillow, between your left / right upper arm and your body to move your elbow a few inches away (about 10 cm) from your side. 4. Hold the end of the band so it is tight and there is no slack. 5. Keeping your elbow pressed against the soft object, move your left / right forearm out, away from your abdomen. Keep your body steady so only your forearm moves. 6. Hold for __________ seconds. 7. Slowly return to the starting position. Repeat __________ times. Complete this exercise __________ times a day. Exercise H:Shoulder Abduction   1. Sit in a stable chair without armrests, or stand. 2. Hold a __________ weight in your left /   right hand, or hold an exercise band with both hands. 3. Start with your arms straight down and your left / right palm facing in, toward your body. 4. Slowly lift your left / right hand out to your side. Do not lift your hand above shoulder height unless your health care provider tells you that this is safe.  Keep your arms straight.  Avoid shrugging your shoulder while you do this movement. Keep your shoulder blade tucked down toward the middle of your back. 5. Hold for __________ seconds. 6. Slowly lower your arm, and return to the starting position. Repeat __________ times. Complete this exercise __________ times a day. Exercise I:Shoulder Extension  1. Sit in a stable chair without armrests, or stand. 2. Secure an exercise band to a stable object in front of you where it is at shoulder height. 3. Hold one end of the exercise band in each hand. Your palms should face each  other. 4. Straighten your elbows and lift your hands up to shoulder height. 5. Step back, away from the secured end of the exercise band, until the band is tight and there is no slack. 6. Squeeze your shoulder blades together as you pull your hands down to the sides of your thighs. Stop when your hands are straight down by your sides. Do not let your hands go behind your body. 7. Hold for __________ seconds. 8. Slowly return to the starting position. Repeat __________ times. Complete this exercise __________ times a day. Exercise J:Standing Shoulder Row  1. Sit in a stable chair without armrests, or stand. 2. Secure an exercise band to a stable object in front of you so it is at waist height. 3. Hold one end of the exercise band in each hand. Your palms should be in a thumbs-up position. 4. Bend each of your elbows to an "L" shape (about 90 degrees) and keep your upper arms at your sides. 5. Step back until the band is tight and there is no slack. 6. Slowly pull your elbows back behind you. 7. Hold for __________ seconds. 8. Slowly return to the starting position. Repeat __________ times. Complete this exercise __________ times a day. Exercise K:Shoulder Press-Ups   1. Sit in a stable chair that has armrests. Sit upright, with your feet flat on the floor. 2. Put your hands on the armrests so your elbows are bent and your fingers are pointing forward. Your hands should be about even with the sides of your body. 3. Push down on the armrests and use your arms to lift yourself off of the chair. Straighten your elbows and lift yourself up as much as you comfortably can.  Move your shoulder blades down, and avoid letting your shoulders move up toward your ears.  Keep your feet on the ground. As you get stronger, your feet should support less of your body weight as you lift yourself up. 4. Hold for __________ seconds. 5. Slowly lower yourself back into the chair. Repeat __________ times.  Complete this exercise __________ times a day. Exercise L: Wall Push-Ups   1. Stand so you are facing a stable wall. Your feet should be about one arm-length away from the wall. 2. Lean forward and place your palms on the wall at shoulder height. 3. Keep your feet flat on the floor as you bend your elbows and lean forward toward the wall. 4. Hold for __________ seconds. 5. Straighten your elbows to push yourself back to the starting position. Repeat __________ times. Complete   this exercise __________ times a day. This information is not intended to replace advice given to you by your health care provider. Make sure you discuss any questions you have with your health care provider. Document Released: 11/10/2004 Document Revised: 09/21/2015 Document Reviewed: 09/07/2014 Elsevier Interactive Patient Education  2017 [employer]  

## 2016-01-01 NOTE — Progress Notes (Signed)
Subjective:    Patient ID: Joann Davis, female    DOB: 22-Dec-1970, 45 y.o.   MRN: [id number]  HPI  Pt presents to the clinic today with c/o right shoulder pain. This started. 1 week ago. She describes the pain as aching, sharp and stabbing with certain movements. She does have associated numbness and tingling down her right arm into her fingers. She denies any injury to the area, but did reach into the back seat of her car, prior to the onset of the pain. She has tried Ibuprofen intermittently without any relief.   She also c/o a rash on her left shin. She noticed this a few months ago. It seems to have gotten worse. It is more red and itchy than prior. The rash has not spread. She denies changes in soaps, lotions or detergents. She has been using a special eczema lotion to the affected area and Hydrocortisone cream with some relief.  Review of Systems      Past Medical History:  Diagnosis Date  . Elevated blood pressure reading without diagnosis of hypertension   . GERD (gastroesophageal reflux disease)   . Migraine with aura, without mention of intractable migraine without mention of status migrainosus   . Pre-eclampsia   . Temporomandibular joint disorders, unspecified     Current Outpatient Prescriptions  Medication Sig Dispense Refill  . azithromycin (ZITHROMAX) 250 MG tablet Take 2 tabs today, then 1 tab daily x 4 days 6 tablet 0  . HYDROcodone-homatropine (HYCODAN) 5-1.5 MG/5ML syrup Take 5 mLs by mouth every 8 (eight) hours as needed for cough. 120 mL 0  . pantoprazole (PROTONIX) 40 MG tablet Take 1 tablet by mouth  daily 90 tablet 3   No current facility-administered medications for this visit.     Allergies  Allergen Reactions  . Monistat [Miconazole]   . Tramadol     Nausea and vomiting    Family History  Problem Relation Age of Onset  . Stroke Father 39    x 2  . Diabetes type II Father   . Hypertension Mother   . Stroke Paternal Grandmother 57  .  Diabetes Paternal Grandfather   . Breast cancer Maternal Grandmother   . Colon cancer Maternal Aunt   . Colon cancer Maternal Uncle     Social History   Social History  . Marital status: Married    Spouse name: N/A  . Number of children: 3  . Years of education: N/A   Occupational History  . [employer] Unemployed   Social History Main Topics  . Smoking status: Never Smoker  . Smokeless tobacco: Not on file  . Alcohol use 0.0 oz/week     Comment: Occasionally (light)  . Drug use: No  . Sexual activity: Not on file   Other Topics Concern  . Not on file   Social History Narrative   Married; 3 children (2 girls, 1 boy)      Works for city [employer], night shift      English degree ([employer]); EMT course     Constitutional: Denies fever, malaise, fatigue, headache or abrupt weight changes.  Musculoskeletal: Pt reports shoulder pain. Denies difficulty with gait, muscle pain or joint swelling.  Skin: Pt reports rash on left shin. Denies rashes.    No other specific complaints in a complete review of systems (except as listed in HPI above).  Objective:   Physical Exam  BP 116/82   Pulse 65   Temp 97.8 F (36.6  C) (Oral)   Wt 176 lb (79.8 kg)   LMP 12/27/2015   SpO2 98%   BMI 27.98 kg/m  Wt Readings from Last 3 Encounters:  01/01/16 176 lb (79.8 kg)  06/25/15 175 lb (79.4 kg)  04/01/15 173 lb 8 oz (78.7 kg)    General: Appears her stated age, well developed, well nourished in NAD. Skin: Large pack of eczema noted on left anterior shin. Musculoskeletal: Normal external rotation of the right shoulder. Limited active internal rotation of the right shoulder. No pain with palpation over the right AC joint or biceps tendon. Pain with palpation of the subacromial bursa. Negative drop can test. Strength 4/5 BUE.  BMET    Component Value Date/Time   NA 140 03/27/2015 1120   K 3.9 03/27/2015 1120   CL 105 03/27/2015 1120   CO2 28 03/27/2015 1120   GLUCOSE 94  03/27/2015 1120   BUN 6 03/27/2015 1120   CREATININE 0.60 03/27/2015 1120   CALCIUM 9.4 03/27/2015 1120   GFRNONAA >60 02/17/2010 0454   GFRAA  02/17/2010 0454    >60        The eGFR has been calculated using the MDRD equation. This calculation has not been validated in all clinical situations. eGFR's persistently <60 mL/min signify possible Chronic Kidney Disease.    Lipid Panel     Component Value Date/Time   CHOL 148 03/27/2015 1120   TRIG 68.0 03/27/2015 1120   HDL 34.50 (L) 03/27/2015 1120   CHOLHDL 4 03/27/2015 1120   VLDL 13.6 03/27/2015 1120   LDLCALC 100 (H) 03/27/2015 1120    CBC    Component Value Date/Time   WBC 6.0 03/27/2015 1120   RBC 4.59 03/27/2015 1120   HGB 13.4 03/27/2015 1120   HCT 40.6 03/27/2015 1120   PLT 445.0 (H) 03/27/2015 1120   MCV 88.4 03/27/2015 1120   MCH 30.0 02/18/2010 0535   MCHC 32.9 03/27/2015 1120   RDW 12.8 03/27/2015 1120   LYMPHSABS 1.3 03/27/2015 1120   MONOABS 0.6 03/27/2015 1120   EOSABS 0.1 03/27/2015 1120   BASOSABS 0.0 03/27/2015 1120    Hgb A1C No results found for: HGBA1C       Assessment & Plan:   Right shoulder pain:  Likely subacromial bursitis She will start taking Ibuprofen 600 mg BID for the next 3-4 days RX printed for Pred Taper x 6 days if no improvement with Ibuprofen Shoulder exercises given  Eczema of left leg:  eRx for Kenalog cream to affected area BID  RTC as needed or if symptoms persist or worsen Kengo Sturges, NP

## 2016-01-20 DIAGNOSIS — Z01419 Encounter for gynecological examination (general) (routine) without abnormal findings: Secondary | ICD-10-CM | POA: Diagnosis not present

## 2016-01-20 LAB — HM PAP SMEAR: HPV 16/18/45 genotyping: NEGATIVE

## 2016-01-26 NOTE — Addendum Note (Signed)
Addended by: Roena MaladyEVONTENNO, Mayan Kloepfer Y on: 01/26/2016 08:34 AM   Modules accepted: Orders

## 2016-04-13 ENCOUNTER — Other Ambulatory Visit: Payer: Self-pay | Admitting: *Deleted

## 2016-04-13 MED ORDER — PANTOPRAZOLE SODIUM 40 MG PO TBEC: DELAYED_RELEASE_TABLET | ORAL | 3 refills | Status: DC

## 2016-10-05 ENCOUNTER — Encounter: Payer: Self-pay | Admitting: Family Medicine

## 2016-10-05 ENCOUNTER — Ambulatory Visit (INDEPENDENT_AMBULATORY_CARE_PROVIDER_SITE_OTHER): Payer: 59 | Admitting: Family Medicine

## 2016-10-05 VITALS — BP 120/78 | HR 62 | Temp 98.4°F | Ht 66.5 in | Wt 179.8 lb

## 2016-10-05 DIAGNOSIS — K219 Gastro-esophageal reflux disease without esophagitis: Secondary | ICD-10-CM | POA: Diagnosis not present

## 2016-10-05 DIAGNOSIS — Z0000 Encounter for general adult medical examination without abnormal findings: Secondary | ICD-10-CM

## 2016-10-05 DIAGNOSIS — Z23 Encounter for immunization: Secondary | ICD-10-CM | POA: Diagnosis not present

## 2016-10-05 NOTE — Patient Instructions (Signed)
Labs today  Flu shot today   Keep exercising when you can  Aim for a healthy diet with more vegetables and fruit   Take care of yourself

## 2016-10-05 NOTE — Progress Notes (Signed)
Subjective:    Patient ID: Joann Davis, female    DOB: 1971/01/04, 46 y.o.   MRN: [id number]  HPI  Here for health maintenance exam and to review chronic medical problems    More forgetful (no confusion) Very busy    Working a lot  Had vacation this summer  Feeling fair - last few days really tired   (? If getting a virus) -- daughter is at home with diarrhea   One arm is achy - ? Why /no injury (radius area) Does type for a living  Ibuprofen helps  Better today  Temp: 98.4 F (36.9 C)    Wt Readings from Last 3 Encounters:  10/05/16 179 lb 12 oz (81.5 kg)  01/01/16 176 lb (79.8 kg)  06/25/15 175 lb (79.4 kg)  has a gym at work -she uses it for 15-20 min at lunch (elliptical)  Some outdoor activities on the weekend  Eats fairly - includes veg and fruit / not always perfect  Does not eat out a lot   28.58 kg/m   Pap 4/18 at [facility] -normal  Mammogram at the same time 4/18-normal   Mammogram 5/16 Self breast exam -no lumps   Wants a flu shot today  Also wellness labs   Tdap 11/13  Lab Results  Component Value Date   CHOL 148 03/27/2015   HDL 34.50 (L) 03/27/2015   LDLCALC 100 (H) 03/27/2015   TRIG 68.0 03/27/2015   CHOLHDL 4 03/27/2015   GERD -on protonix 40 mg daily   H/o gestational DM -wants to watch for that  Colon cancer in aunt /uncle and breast cancer in [place]  Needs wellness labs today    Patient Active Problem List   Diagnosis Date Noted  . Routine general medical examination at a health care facility 11/20/2011  . MIGRAINE WITH AURA 02/11/2009  . GERD 10/24/2006   Past Medical History:  Diagnosis Date  . Elevated blood pressure reading without diagnosis of hypertension   . GERD (gastroesophageal reflux disease)   . Migraine with aura, without mention of intractable migraine without mention of status migrainosus   . Pre-eclampsia   . Temporomandibular joint disorders, unspecified    No past surgical history on  file. Social History  Substance Use Topics  . Smoking status: Never Smoker  . Smokeless tobacco: Never Used  . Alcohol use 0.0 oz/week     Comment: Occasionally (light)   Family History  Problem Relation Age of Onset  . Stroke Father 39       x 2  . Diabetes type II Father   . Hypertension Mother   . Stroke Paternal Grandmother 18  . Diabetes Paternal Grandfather   . Breast cancer Maternal Grandmother   . Colon cancer Maternal Aunt   . Colon cancer Maternal Uncle    Allergies  Allergen Reactions  . Monistat [Miconazole]   . Tramadol     Nausea and vomiting   Current Outpatient Prescriptions on File Prior to Visit  Medication Sig Dispense Refill  . pantoprazole (PROTONIX) 40 MG tablet Take 1 tablet by mouth  daily 90 tablet 3  . triamcinolone ointment (KENALOG) 0.5 % Apply 1 application topically 2 (two) times daily. 30 g 0   No current facility-administered medications on file prior to visit.     Review of Systems  Constitutional: Negative for activity change, appetite change, fatigue, fever and unexpected weight change.  HENT: Negative for congestion, ear pain, rhinorrhea, sinus pressure and  sore throat.   Eyes: Negative for pain, redness and visual disturbance.  Respiratory: Negative for cough, shortness of breath and wheezing.   Cardiovascular: Negative for chest pain and palpitations.  Gastrointestinal: Negative for abdominal pain, blood in stool, constipation and diarrhea.  Endocrine: Negative for polydipsia and polyuria.  Genitourinary: Negative for dysuria, frequency and urgency.  Musculoskeletal: Negative for arthralgias, back pain and myalgias.       Pos for arm pain that is now improved  Skin: Negative for pallor and rash.  Allergic/Immunologic: Negative for environmental allergies.  Neurological: Negative for dizziness, syncope and headaches.  Hematological: Negative for adenopathy. Does not bruise/bleed easily.  Psychiatric/Behavioral: Negative for  dysphoric mood. The patient is not nervous/anxious.        Objective:   Physical Exam  Constitutional: She appears well-developed and well-nourished. No distress.  overwt and well app  HENT:  Head: Normocephalic and atraumatic.  Right Ear: External ear normal.  Left Ear: External ear normal.  Nose: Nose normal.  Mouth/Throat: Oropharynx is clear and moist.  Eyes: Pupils are equal, round, and reactive to light. Conjunctivae and EOM are normal. Right eye exhibits no discharge. Left eye exhibits no discharge. No scleral icterus.  Neck: Normal range of motion. Neck supple. No JVD present. Carotid bruit is not present. No thyromegaly present.  Cardiovascular: Normal rate, regular rhythm, normal heart sounds and intact distal pulses.  Exam reveals no gallop.   Pulmonary/Chest: Effort normal and breath sounds normal. No respiratory distress. She has no wheezes. She has no rales.  Abdominal: Soft. Bowel sounds are normal. She exhibits no distension and no mass. There is no tenderness.  Musculoskeletal: She exhibits no edema or tenderness.  Lymphadenopathy:    She has no cervical adenopathy.  Neurological: She is alert. She has normal reflexes. No cranial nerve deficit. She exhibits normal muscle tone. Coordination normal.  Skin: Skin is warm and dry. No rash noted. No erythema. No pallor.  Solar lentigines diffusely   Psychiatric: She has a normal mood and affect.          Assessment & Plan:   Problem List Items Addressed This Visit      Digestive   GERD    Pt continues protonix daily -has not been able to get by w/o it  Disc diet for gerd          Other   Routine general medical examination at a health care facility - Primary    Reviewed health habits including diet and exercise and skin cancer prevention Reviewed appropriate screening tests for age  Also reviewed health mt list, fam hx and immunization status , as well as social and family history   See HPI Labs today   Flu shot today  Enc good self care       Relevant Orders   CBC with Differential/Platelet   Comprehensive metabolic panel   Lipid panel   TSH    Other Visit Diagnoses    Need for influenza vaccination       Relevant Orders   Flu Vaccine QUAD 6+ mos PF IM (Fluarix Quad PF) (Completed)

## 2016-10-06 ENCOUNTER — Encounter: Payer: Self-pay | Admitting: *Deleted

## 2016-10-06 LAB — COMPREHENSIVE METABOLIC PANEL
ALT: 14 U/L (ref 0–35)
AST: 18 U/L (ref 0–37)
Albumin: 4.1 g/dL (ref 3.5–5.2)
Alkaline Phosphatase: 56 U/L (ref 39–117)
BUN: 9 mg/dL (ref 6–23)
CO2: 28 mEq/L (ref 19–32)
Calcium: 9.2 mg/dL (ref 8.4–10.5)
Chloride: 102 mEq/L (ref 96–112)
Creatinine, Ser: 0.59 mg/dL (ref 0.40–1.20)
GFR: 116.21 mL/min (ref >60.00)
Glucose, Bld: 96 mg/dL (ref 70–99)
Potassium: 3.9 mEq/L (ref 3.5–5.1)
Sodium: 137 mEq/L (ref 135–145)
Total Bilirubin: 0.2 mg/dL (ref 0.2–1.2)
Total Protein: 6.8 g/dL (ref 6.0–8.3)

## 2016-10-06 LAB — LIPID PANEL
Cholesterol: 169 mg/dL (ref 0–200)
HDL: 41.8 mg/dL (ref >39.00)
LDL Cholesterol: 107 mg/dL — ABNORMAL HIGH (ref 0–99)
NonHDL: 127.2
Total CHOL/HDL Ratio: 4
Triglycerides: 100 mg/dL (ref 0.0–149.0)
VLDL: 20 mg/dL (ref 0.0–40.0)

## 2016-10-06 LAB — CBC WITH DIFFERENTIAL/PLATELET
Basophils Absolute: 0.1 10*3/uL (ref 0.0–0.1)
Basophils Relative: 1.1 % (ref 0.0–3.0)
Eosinophils Absolute: 0.1 10*3/uL (ref 0.0–0.7)
Eosinophils Relative: 1.5 % (ref 0.0–5.0)
HCT: 40.4 % (ref 36.0–46.0)
Hemoglobin: 13 g/dL (ref 12.0–15.0)
Lymphocytes Relative: 24.2 % (ref 12.0–46.0)
Lymphs Abs: 1.5 10*3/uL (ref 0.7–4.0)
MCHC: 32.3 g/dL (ref 30.0–36.0)
MCV: 84 fl (ref 78.0–100.0)
Monocytes Absolute: 0.6 10*3/uL (ref 0.1–1.0)
Monocytes Relative: 10.4 % (ref 3.0–12.0)
Neutro Abs: 3.9 10*3/uL (ref 1.4–7.7)
Neutrophils Relative %: 62.8 % (ref 43.0–77.0)
Platelets: 401 10*3/uL — ABNORMAL HIGH (ref 150.0–400.0)
RBC: 4.81 Mil/uL (ref 3.87–5.11)
RDW: 16.7 % — ABNORMAL HIGH (ref 11.5–15.5)
WBC: 6.2 10*3/uL (ref 4.0–10.5)

## 2016-10-06 LAB — TSH: TSH: 0.78 u[IU]/mL (ref 0.35–4.50)

## 2016-10-06 NOTE — Assessment & Plan Note (Addendum)
Pt continues protonix daily -has not been able to get by w/o it  Disc diet for gerd

## 2016-10-06 NOTE — Assessment & Plan Note (Signed)
Reviewed health habits including diet and exercise and skin cancer prevention Reviewed appropriate screening tests for age  Also reviewed health mt list, fam hx and immunization status , as well as social and family history   See HPI Labs today  Flu shot today  Enc good self care

## 2017-02-01 DIAGNOSIS — Z1231 Encounter for screening mammogram for malignant neoplasm of breast: Secondary | ICD-10-CM | POA: Diagnosis not present

## 2017-02-01 DIAGNOSIS — Z01419 Encounter for gynecological examination (general) (routine) without abnormal findings: Secondary | ICD-10-CM | POA: Diagnosis not present

## 2017-02-01 DIAGNOSIS — Z6827 Body mass index (BMI) 27.0-27.9, adult: Secondary | ICD-10-CM | POA: Diagnosis not present

## 2017-02-01 DIAGNOSIS — Z3040 Encounter for surveillance of contraceptives, unspecified: Secondary | ICD-10-CM | POA: Diagnosis not present

## 2017-03-07 ENCOUNTER — Other Ambulatory Visit: Payer: Self-pay | Admitting: Family Medicine

## 2017-06-25 DIAGNOSIS — J029 Acute pharyngitis, unspecified: Secondary | ICD-10-CM | POA: Diagnosis not present

## 2017-06-25 DIAGNOSIS — J069 Acute upper respiratory infection, unspecified: Secondary | ICD-10-CM | POA: Diagnosis not present

## 2017-06-28 ENCOUNTER — Ambulatory Visit: Payer: 59 | Admitting: Family Medicine

## 2017-06-28 ENCOUNTER — Encounter: Payer: Self-pay | Admitting: Family Medicine

## 2017-06-28 VITALS — BP 118/78 | HR 83 | Temp 98.0°F | Ht 66.5 in | Wt 185.0 lb

## 2017-06-28 DIAGNOSIS — E663 Overweight: Secondary | ICD-10-CM

## 2017-06-28 DIAGNOSIS — E669 Obesity, unspecified: Secondary | ICD-10-CM | POA: Insufficient documentation

## 2017-06-28 DIAGNOSIS — J069 Acute upper respiratory infection, unspecified: Secondary | ICD-10-CM

## 2017-06-28 DIAGNOSIS — Z309 Encounter for contraceptive management, unspecified: Secondary | ICD-10-CM | POA: Insufficient documentation

## 2017-06-28 MED ORDER — BENZONATATE 100 MG PO CAPS: 100.0000 mg | ORAL_CAPSULE | Freq: Three times a day (TID) | ORAL | 0 refills | Status: DC | PRN

## 2017-06-28 MED ORDER — HYDROCODONE-HOMATROPINE 5-1.5 MG/5ML PO SYRP: 5.0000 mL | ORAL_SOLUTION | Freq: Three times a day (TID) | ORAL | 0 refills | Status: DC | PRN

## 2017-06-28 NOTE — Patient Instructions (Signed)
Good to see you today  I have sent two cough medicines to your pharmacy- can take liquid if you don't have to drive, can take pills during day  Drink enough liquids to make your urine light yellow.   If not better in 3-5 days, please come back in

## 2017-06-28 NOTE — Progress Notes (Signed)
Subjective:    Patient ID: Joann Davis, female    DOB: 04/28/1970, 47 y.o.   MRN: [id number]  HPI This is a 47 yo female who presents today with cough x 1 week. Started with sore throat, dry cough, was productive, headache with cough, no ear pain, no wheeze, SOB with cough. [place] fever. Clear nasal drainage. [facility] days ago and was given Amoxicillin with improvement of sore throat and clearing of sputum. Has been taking Dayquil with [place] relief. 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 Started with fatigue and body aches. Now fatigued because sleep disrupted with cough. Husband with similar symptoms.   Past Medical History:  Diagnosis Date  . Elevated blood pressure reading without diagnosis of hypertension   . GERD (gastroesophageal reflux disease)   . Migraine with aura, without mention of intractable migraine without mention of status migrainosus   . Pre-eclampsia   . Temporomandibular joint disorders, unspecified    No past surgical history on file. Family History  Problem Relation Age of Onset  . Stroke Father 39       x 2  . Diabetes type II Father   . Hypertension Mother   . Stroke Paternal Grandmother [phone]  . Diabetes Paternal Grandfather   . Breast cancer Maternal Grandmother   . Colon cancer Maternal Aunt   . Colon cancer Maternal Uncle    Social History   Tobacco Use  . Smoking status: Never Smoker  . Smokeless tobacco: Never Used  Substance Use Topics  . Alcohol use: Yes    Alcohol/week: 0.0 oz    Comment: Occasionally (light)  . Drug use: No      Review of Systems Per HPI    Objective:   Physical Exam  Constitutional: She is oriented to person, place, and time. She appears well-developed and well-nourished. No distress.  Looks fatigued, frequent dry cough.   HENT:  Head: Normocephalic and atraumatic.  Right Ear: Tympanic membrane, external ear and ear canal normal.  Left Ear: Tympanic membrane, external ear  and ear canal normal.  Nose: Mucosal edema and rhinorrhea present.  Mouth/Throat: Oropharynx is clear and moist.  Eyes: Conjunctivae are normal.  Neck: Normal range of motion. Neck supple.  Cardiovascular: Normal rate, regular rhythm and normal heart sounds.  Pulmonary/Chest: Effort normal and breath sounds normal.  Lymphadenopathy:    She has no cervical adenopathy.  Neurological: She is alert and oriented to person, place, and time.  Skin: Skin is warm and dry. She is not diaphoretic.  Psychiatric: She has a normal mood and affect. Her behavior is normal. Judgment and thought content normal.  Vitals reviewed.     BP 118/78 (BP Location: Right Arm, Patient Position: Sitting, Cuff Size: Large)   Pulse 83   Temp 98 F (36.7 C) (Oral)   Ht 5' 6.5" (1.689 m)   Wt 185 lb (83.9 kg)   LMP 06/21/2017   SpO2 98%   BMI 29.41 kg/m  Wt Readings from Last 3 Encounters:  06/28/17 185 lb (83.9 kg)  10/05/16 179 lb 12 oz (81.5 kg)  01/01/16 176 lb (79.8 kg)       Assessment & Plan:  1. URI with cough and congestion - Provided written and verbal information regarding diagnosis and treatment. - RTC precautions reviewed - she was instructed to finish antibiotic - benzonatate (TESSALON) 100 MG capsule; Take 1-2 capsules (100-200 mg total) by mouth 3 (three) times daily as needed.  Dispense:  30 capsule; Refill: 0 - HYDROcodone-homatropine (HYCODAN) 5-1.5 MG/5ML syrup; Take 5 mLs by mouth every 8 (eight) hours as needed for cough.  Dispense: 120 mL; Refill: 0   Olean Ree, FNP-BC  [facility], [facility]  06/28/2017 5:57 PM

## 2017-07-14 ENCOUNTER — Ambulatory Visit: Payer: Self-pay | Admitting: Family Medicine

## 2017-07-14 VITALS — BP 122/80 | HR 80 | Temp 98.0°F | Resp 18 | Wt 184.0 lb

## 2017-07-14 DIAGNOSIS — T63424A Toxic effect of venom of ants, undetermined, initial encounter: Secondary | ICD-10-CM

## 2017-07-14 DIAGNOSIS — L03115 Cellulitis of right lower limb: Secondary | ICD-10-CM

## 2017-07-14 MED ORDER — DOXYCYCLINE HYCLATE 100 MG PO CAPS: 100.0000 mg | ORAL_CAPSULE | Freq: Two times a day (BID) | ORAL | 0 refills | Status: DC

## 2017-07-14 MED ORDER — PREDNISONE 20 MG PO TABS: ORAL_TABLET | ORAL | 0 refills | Status: DC

## 2017-07-14 MED ORDER — PREDNISONE 20 MG PO TABS: ORAL_TABLET | ORAL | 0 refills | Status: AC

## 2017-07-14 MED ORDER — MUPIROCIN 2 % EX OINT: 1.0000 "application " | TOPICAL_OINTMENT | Freq: Three times a day (TID) | CUTANEOUS | 0 refills | Status: DC

## 2017-07-14 NOTE — Patient Instructions (Addendum)
Start 3 days prednisone taper. Take medication as indicated. Start antibiotic therapy today, Doxycyline 100 mg twice daily x 10 days.  I am also prescribing mupirocin ointment, 3 times daily for at least 7 days.  Go immediately to the Emergency Department if your toes or foot swelling or redness worsens. This is the sign of worsening infection.    Cellulitis, Adult Cellulitis is a skin infection. The infected area is usually red and sore. This condition occurs most often in the arms and lower legs. It is very important to get treated for this condition. Follow these instructions at home:  Take over-the-counter and prescription medicines only as told by your doctor.  If you were prescribed an antibiotic medicine, take it as told by your doctor. Do not stop taking the antibiotic even if you start to feel better.  Drink enough fluid to keep your pee (urine) clear or pale yellow.  Do not touch or rub the infected area.  Raise (elevate) the infected area above the level of your heart while you are sitting or lying down.  Place warm or cold wet cloths (warm or cold compresses) on the infected area. Do this as told by your doctor.  Keep all follow-up visits as told by your doctor. This is important. These visits let your doctor make sure your infection is not getting worse. Contact a doctor if:  You have a fever.  Your symptoms do not get better after 1-2 days of treatment.  Your bone or joint under the infected area starts to hurt after the skin has healed.  Your infection comes back. This can happen in the same area or another area.  You have a swollen bump in the infected area.  You have new symptoms.  You feel ill and also have muscle aches and pains. Get help right away if:  Your symptoms get worse.  You feel very sleepy.  You throw up (vomit) or have watery poop (diarrhea) for a long time.  There are red streaks coming from the infected area.  Your red area gets  larger.  Your red area turns darker. This information is not intended to replace advice given to you by your health care provider. Make sure you discuss any questions you have with your health care provider. Document Released: 06/15/2007 Document Revised: 06/04/2015 Document Reviewed: 11/05/2014 Elsevier Interactive Patient Education  2018 [employer]

## 2017-07-14 NOTE — Progress Notes (Signed)
Patient ID: Joann Davis, female    DOB: 01/25/1970, 47 y.o.   MRN: [id number]  PCP: Judy Pimpleower, Marne A, MD  Chief Complaint  Patient presents with  . Insect Bite    Subjective:  HPI Joann Davis is a 47 y.o. female presents for evaluation of an insect bites which occurred 3 days previously. Patient reports being bitten by "fire ants". Initially, 3rd and 4th digit of her right foot were only mildly painful and itchy after incident occurred. Over the course of the last  3 days, she has experienced worsening pain at the site, swelling extending of affected toes, and pustular type lesions. She reports some nausea without vomiting episodes since bite occurred. She has not attempted relief with any medication. Denies shortness of breath, headache, chills, or fever.  Social History   Socioeconomic History  . Marital status: Married    Spouse name: Not on file  . Number of children: 3  . Years of education: Not on file  . Highest education level: Not on file  Occupational History  . 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 Financial resource strain: Not on file  . Food insecurity:    Worry: Not on file    Inability: Not on file  . Transportation needs:    Medical: Not on file    Non-medical: Not on file  Tobacco Use  . Smoking status: Never Smoker  . Smokeless tobacco: Never Used  Substance and Sexual Activity  . Alcohol use: Yes    Alcohol/week: 0.0 oz    Comment: Occasionally (light)  . Drug use: No  . Sexual activity: Not on file  Lifestyle  . Physical activity:    Days per week: Not on file    Minutes per session: Not on file  . Stress: Not on file  Relationships  . Social connections:    Talks on phone: Not on file    Gets together: Not on file    Attends religious service: Not on file    Active member of club or organization: Not on file    Attends meetings of clubs or organizations: Not on file    Relationship status: Not on file   . Intimate partner violence:    Fear of current or ex partner: Not on file    Emotionally abused: Not on file    Physically abused: Not on file    Forced sexual activity: Not on file  Other Topics Concern  . Not on file  Social History Narrative   Married; 3 children (2 girls, 1 boy)      Works for city [employer], night shift      English degree ([employer]); EMT course    Family History  Problem Relation Age of Onset  . Stroke Father 39       x 2  . Diabetes type II Father   . Hypertension Mother   . Stroke Paternal Grandmother [phone]  . Diabetes Paternal Grandfather   . Breast cancer Maternal Grandmother   . Colon cancer Maternal Aunt   . Colon cancer Maternal Uncle    Review of Systems Pertinent negative listed in HPI  Patient Active Problem List   Diagnosis Date Noted  . Overweight with body mass index (BMI) 25.0-29.9 06/28/2017  . Contraception management 06/28/2017  . Routine general medical examination at a health care facility 11/20/2011  . MIGRAINE WITH AURA 02/11/2009  . GERD 10/24/2006    Allergies  Allergen  Reactions  . Monistat [Miconazole]   . Tramadol     Nausea and vomiting    Prior to Admission medications   Medication Sig Start Date End Date Taking? Authorizing Provider  benzonatate (TESSALON) 100 MG capsule Take 1-2 capsules (100-200 mg total) by mouth 3 (three) times daily as needed. 06/28/17   Emi Belfast, FNP  HYDROcodone-homatropine (HYCODAN) 5-1.5 MG/5ML syrup Take 5 mLs by mouth every 8 (eight) hours as needed for cough. 06/28/17   Emi Belfast, FNP  ibuprofen (ADVIL,MOTRIN) 200 MG tablet Take 200 mg by mouth as needed.    [provider]  Multiple Vitamin (MULTIVITAMIN) tablet Take 1 tablet by mouth daily.    [provider]  pantoprazole (PROTONIX) 40 MG tablet TAKE 1 TABLET BY MOUTH  DAILY 03/08/17   Tower, Audrie Gallus, MD  triamcinolone ointment (KENALOG) 0.5 % Apply 1 application topically 2 (two) times daily. 01/01/16    Lorre Munroe, NP   Past Medical, Surgical Family and Social History reviewed and updated.    Objective:   Today's Vitals   07/14/17 1004  BP: 122/80  Pulse: 80  Resp: 18  Temp: 98 F (36.7 C)  TempSrc: Oral  SpO2: 98%  Weight: 184 lb (83.5 kg)    Wt Readings from Last 3 Encounters:  06/28/17 185 lb (83.9 kg)  10/05/16 179 lb 12 oz (81.5 kg)  01/01/16 176 lb (79.8 kg)   Physical Exam  Constitutional: She appears well-developed and well-nourished.  HENT:  Head: Normocephalic.  Cardiovascular: Normal rate, regular rhythm, normal heart sounds and intact distal pulses.  Pulmonary/Chest: Effort normal and breath sounds normal. No respiratory distress. She exhibits no tenderness.  Feet:  Right Foot:  Skin Integrity: Positive for blister, erythema and warmth.  Skin: Skin is warm and dry.  Psychiatric: She has a normal mood and affect. Her behavior is normal. Judgment and thought content normal.   Assessment & Plan:  1. Fire ant bite, undetermined intent, initial encounter 2. Cellulitis of right lower extremity  Patient presents today with a 3 days history of ant bites. Right 3rd and 4th digit of toes are significant for infection with the presence erythema, pustular type blistering on toes, and edema. Treating skin infection (suspected cellulitis) empirically with broad-spectrum antibiotic therapy. Prescribing a short-course of prednisone to reduce inflammation and itching. Adding a topical mupirocin ointment to reduce risk of MRSA. Patient given strict ED follow-up precautions to follow-up if swelling or redness worsened.   If symptoms worsen or do not improve, return for follow-up, follow-up with PCP, or at the emergency department if severity of symptoms warrant a higher level of care.    Godfrey Pick. Tiburcio Pea, MSN, FNP-C [facility]  [address]  [place], [place] [address] [phone]

## 2017-07-17 ENCOUNTER — Telehealth: Payer: Self-pay | Admitting: Emergency Medicine

## 2017-07-17 NOTE — Telephone Encounter (Signed)
Left message follow up call from visit with [facility]. 

## 2017-08-11 ENCOUNTER — Other Ambulatory Visit: Payer: Self-pay | Admitting: Family Medicine

## 2017-11-05 DIAGNOSIS — Z23 Encounter for immunization: Secondary | ICD-10-CM | POA: Diagnosis not present

## 2017-12-30 ENCOUNTER — Other Ambulatory Visit: Payer: Self-pay | Admitting: Family Medicine

## 2018-01-01 NOTE — Telephone Encounter (Signed)
I left a message on patient's voice mail to return my call.  If patient returns my call, please schedule follow up appointment with Dr.Tower. 

## 2018-01-01 NOTE — Telephone Encounter (Signed)
No recent f/u or CPE and no future appts., please advise  

## 2018-01-01 NOTE — Telephone Encounter (Signed)
Please schedule f/u and refill until then  

## 2018-01-15 ENCOUNTER — Other Ambulatory Visit: Payer: Self-pay | Admitting: Family Medicine

## 2018-01-16 NOTE — Telephone Encounter (Signed)
Please schedule f/u and refill until then  

## 2018-01-16 NOTE — Telephone Encounter (Signed)
Joann Davis scheduled appt and med refilled once

## 2018-01-16 NOTE — Telephone Encounter (Signed)
No recent or future f/u or CPE and pt hasn't seen you in over a year, please advise

## 2018-01-22 ENCOUNTER — Ambulatory Visit: Payer: 59 | Admitting: Family Medicine

## 2018-01-25 ENCOUNTER — Telehealth: Payer: Self-pay

## 2018-01-25 NOTE — Telephone Encounter (Signed)
I did not call pt. 

## 2018-01-25 NOTE — Telephone Encounter (Signed)
TH faxed note pt returning call. Unable to reach pt by phone.

## 2018-01-26 ENCOUNTER — Ambulatory Visit: Payer: 59 | Admitting: Family Medicine

## 2018-01-26 ENCOUNTER — Encounter: Payer: Self-pay | Admitting: Family Medicine

## 2018-01-26 VITALS — BP 104/66 | HR 69 | Temp 98.5°F | Ht 66.5 in | Wt 188.0 lb

## 2018-01-26 DIAGNOSIS — E663 Overweight: Secondary | ICD-10-CM | POA: Diagnosis not present

## 2018-01-26 DIAGNOSIS — K219 Gastro-esophageal reflux disease without esophagitis: Secondary | ICD-10-CM

## 2018-01-26 MED ORDER — PANTOPRAZOLE SODIUM 40 MG PO TBEC
40.0000 mg | DELAYED_RELEASE_TABLET | Freq: Every day | ORAL | 3 refills | Status: DC
Start: 2018-01-26 — End: 2019-02-08

## 2018-01-26 NOTE — Patient Instructions (Signed)
Try to get 1200-1500 mg of calcium per day with at least 1000 iu of vitamin D - for bone health   Exercise - this is good overall health and your bones   Avoid foods that worsen reflux    Take care of yourself

## 2018-01-26 NOTE — Assessment & Plan Note (Signed)
Enc more exercise and healthy diet

## 2018-01-26 NOTE — Progress Notes (Signed)
Subjective:    Patient ID: Joann Davis, female    DOB: 02/12/1970, 48 y.o.   MRN: [id number]  HPI Here for f/u of chronic health problems  Wt Readings from Last 3 Encounters:  01/26/18 188 lb (85.3 kg)  07/14/17 184 lb (83.5 kg)  06/28/17 185 lb (83.9 kg)   29.89 kg/m   Doing ok  Taking care of herself  Trying to eat healthy - more green vegetables   Exercise - getting back on track - uses the gym at work 20 min at lunch /ellipitical  She is busy and training someone now   Still takes protonix daily for GERD Cannot miss a dose Watches her diet  Avoids red sauce/pizza on a regular basis or late  Otherwise doing well   No other medicines right now  Vitamins  occ nsaids   Not on contraception-husband had a vasectomy    Last mammogram - had one last jan [facility])   Flu shot in [place]   Patient Active Problem List   Diagnosis Date Noted  . Overweight with body mass index (BMI) 25.0-29.9 06/28/2017  . Routine general medical examination at a health care facility 11/20/2011  . MIGRAINE WITH AURA 02/11/2009  . GERD 10/24/2006   Past Medical History:  Diagnosis Date  . Elevated blood pressure reading without diagnosis of hypertension   . GERD (gastroesophageal reflux disease)   . Migraine with aura, without mention of intractable migraine without mention of status migrainosus   . Pre-eclampsia   . Temporomandibular joint disorders, unspecified    History reviewed. No pertinent surgical history. Social History   Tobacco Use  . Smoking status: Never Smoker  . Smokeless tobacco: Never Used  Substance Use Topics  . Alcohol use: Yes    Alcohol/week: 0.0 standard drinks    Comment: Occasionally (light)  . Drug use: No   Family History  Problem Relation Age of Onset  . Stroke Father 39       x 2  . Diabetes type II Father   . Hypertension Mother   . Stroke Paternal Grandmother [id number]  . Diabetes Paternal Grandfather   . Breast cancer Maternal Grandmother    . Colon cancer Maternal Aunt   . Colon cancer Maternal Uncle    Allergies  Allergen Reactions  . Monistat [Miconazole]   . Tramadol     Nausea and vomiting   Current Outpatient Medications on File Prior to Visit  Medication Sig Dispense Refill  . ibuprofen (ADVIL,MOTRIN) 200 MG tablet Take 200 mg by mouth as needed.    . Multiple Vitamin (MULTIVITAMIN) tablet Take 1 tablet by mouth daily.     No current facility-administered medications on file prior to visit.     Review of Systems  Constitutional: Negative for activity change, appetite change, fatigue, fever and unexpected weight change.  HENT: Negative for congestion, ear pain, rhinorrhea, sinus pressure and sore throat.   Eyes: Negative for pain, redness and visual disturbance.  Respiratory: Negative for cough, shortness of breath and wheezing.   Cardiovascular: Negative for chest pain and palpitations.  Gastrointestinal: Negative for abdominal pain, blood in stool, constipation and diarrhea.       Heartburn if she does not eat right  Endocrine: Negative for polydipsia and polyuria.  Genitourinary: Negative for dysuria, frequency and urgency.  Musculoskeletal: Negative for arthralgias, back pain and myalgias.  Skin: Negative for pallor and rash.  Allergic/Immunologic: Negative for environmental allergies.  Neurological: Negative for dizziness, syncope and headaches.  Hematological: Negative for adenopathy. Does not bruise/bleed easily.  Psychiatric/Behavioral: Negative for decreased concentration and dysphoric mood. The patient is not nervous/anxious.        Objective:   Physical Exam Constitutional:      General: She is not in acute distress.    Appearance: She is well-developed. She is obese. She is not ill-appearing.  HENT:     Head: Normocephalic and atraumatic.     Right Ear: Tympanic membrane normal.     Left Ear: Tympanic membrane normal.     Mouth/Throat:     Mouth: Mucous membranes are moist.     Pharynx:  Oropharynx is clear.  Eyes:     General: No scleral icterus.    Conjunctiva/sclera: Conjunctivae normal.     Pupils: Pupils are equal, round, and reactive to light.  Neck:     Musculoskeletal: Normal range of motion and neck supple.  Cardiovascular:     Rate and Rhythm: Normal rate and regular rhythm.     Heart sounds: Normal heart sounds.  Pulmonary:     Effort: Pulmonary effort is normal. No respiratory distress.     Breath sounds: Normal breath sounds. No wheezing or rales.  Abdominal:     General: Bowel sounds are normal. There is no distension.     Palpations: Abdomen is soft. There is no mass.     Tenderness: There is no abdominal tenderness. There is no guarding or rebound.     Hernia: No hernia is present.  Lymphadenopathy:     Cervical: No cervical adenopathy.  Skin:    General: Skin is warm and dry.     Coloration: Skin is not jaundiced or pale.     Findings: No erythema.  Neurological:     Mental Status: She is alert. Mental status is at baseline.     Cranial Nerves: No cranial nerve deficit.     Coordination: Coordination normal.     Deep Tendon Reflexes: Reflexes normal.  Psychiatric:        Mood and Affect: Mood normal.           Assessment & Plan:   Problem List Items Addressed This Visit      Digestive   GERD - Primary    Continues protonix  Symptoms are controlled if she eats right and does not skip a dose Disc inc risk of OP with this and recommend ca plus D and exercise  Also wt loss        Relevant Medications   pantoprazole (PROTONIX) 40 MG tablet     Other   Overweight with body mass index (BMI) 25.0-29.9    Enc more exercise and healthy diet

## 2018-01-26 NOTE — Assessment & Plan Note (Signed)
Continues protonix  Symptoms are controlled if she eats right and does not skip a dose Disc inc risk of OP with this and recommend ca plus D and exercise  Also wt loss

## 2018-02-23 DIAGNOSIS — Z3040 Encounter for surveillance of contraceptives, unspecified: Secondary | ICD-10-CM | POA: Diagnosis not present

## 2018-02-23 DIAGNOSIS — Z01419 Encounter for gynecological examination (general) (routine) without abnormal findings: Secondary | ICD-10-CM | POA: Diagnosis not present

## 2018-02-23 DIAGNOSIS — Z6829 Body mass index (BMI) 29.0-29.9, adult: Secondary | ICD-10-CM | POA: Diagnosis not present

## 2018-02-23 DIAGNOSIS — Z1231 Encounter for screening mammogram for malignant neoplasm of breast: Secondary | ICD-10-CM | POA: Diagnosis not present

## 2018-02-23 LAB — HM MAMMOGRAPHY: HM Mammogram: NORMAL (ref 0–4)

## 2018-03-19 ENCOUNTER — Ambulatory Visit: Payer: 59 | Admitting: Family Medicine

## 2018-03-19 ENCOUNTER — Encounter: Payer: Self-pay | Admitting: Family Medicine

## 2018-03-19 VITALS — BP 114/78 | HR 88 | Temp 98.1°F | Resp 14 | Ht 66.5 in | Wt 188.2 lb

## 2018-03-19 DIAGNOSIS — S61051A Open bite of right thumb without damage to nail, initial encounter: Secondary | ICD-10-CM | POA: Diagnosis not present

## 2018-03-19 DIAGNOSIS — W5501XA Bitten by cat, initial encounter: Secondary | ICD-10-CM

## 2018-03-19 MED ORDER — AMOXICILLIN-POT CLAVULANATE 875-125 MG PO TABS: 1.0000 | ORAL_TABLET | Freq: Two times a day (BID) | ORAL | 0 refills | Status: AC

## 2018-03-19 NOTE — Progress Notes (Signed)
   Subjective:     Joann Davis is a 48 y.o. female presenting for Animal Bite (happened today about an hour ago. Bit right thumb. Patient's cat did this while the cat was having a seuzire.)     Animal Bite   The incident occurred just prior to arrival. [facility] There is an injury to the right thumb. The pain is mild. It is unlikely that a foreign body is present. Associated symptoms include light-headedness (resolved). Pertinent negatives include no headaches and no tingling. There have been no prior injuries to these areas. She is right-handed. Her tetanus status is UTD. She has been behaving normally.   Washed it and use some cleaning solution to clean the wound out    Review of Systems  Neurological: Positive for light-headedness (resolved). Negative for tingling and headaches.     Social History   Tobacco Use  Smoking Status Never Smoker  Smokeless Tobacco Never Used        Objective:    BP Readings from Last 3 Encounters:  03/19/18 114/78  01/26/18 104/66  07/14/17 122/80   Wt Readings from Last 3 Encounters:  03/19/18 188 lb 4 oz (85.4 kg)  01/26/18 188 lb (85.3 kg)  07/14/17 184 lb (83.5 kg)    BP 114/78   Pulse 88   Temp 98.1 F (36.7 C)   Resp 14   Ht 5' 6.5" (1.689 m)   Wt 188 lb 4 oz (85.4 kg)   LMP 03/06/2018   BMI 29.93 kg/m    Physical Exam Constitutional:      General: She is not in acute distress.    Appearance: She is well-developed. She is not diaphoretic.  HENT:     Right Ear: External ear normal.     Left Ear: External ear normal.     Nose: Nose normal.  Eyes:     Conjunctiva/sclera: Conjunctivae normal.  Neck:     Musculoskeletal: Neck supple.  Cardiovascular:     Rate and Rhythm: Normal rate.     Pulses: Normal pulses.  Pulmonary:     Effort: Pulmonary effort is normal.  Musculoskeletal:     Comments: Normal thumb ROM and strength, though with some pain.   Skin:    General: Skin is warm and dry.   Capillary Refill: Capillary refill takes less than 2 seconds.     Comments: Right thumb with 1 deep puncture and 4 other smaller puncture wounds. One still lightly bleeding.   Neurological:     Mental Status: She is alert. Mental status is at baseline.  Psychiatric:        Mood and Affect: Mood normal.        Behavior: Behavior normal.           Assessment & Plan:   Problem List Items Addressed This Visit    None    Visit Diagnoses    Cat bite of right thumb, initial encounter    -  Primary   Relevant Medications   amoxicillin-clavulanate (AUGMENTIN) 875-125 MG tablet     Given puncture wound to thumb will do prophylactic treatment.   Return precautions discussed. If infection or abscess -- may need to see hand surgeon.   Return if symptoms worsen or fail to improve.  Lynnda Child, MD

## 2018-03-19 NOTE — Patient Instructions (Signed)
#  Cat Bite - Take the antibiotic  If redness, swelling, inability to move the thumb, more pain, fevers/chill then call the clinic or return to clinic.

## 2018-04-06 ENCOUNTER — Ambulatory Visit: Payer: 59 | Admitting: Family Medicine

## 2018-04-06 ENCOUNTER — Other Ambulatory Visit: Payer: Self-pay

## 2018-04-06 ENCOUNTER — Ambulatory Visit (INDEPENDENT_AMBULATORY_CARE_PROVIDER_SITE_OTHER)
Admission: RE | Admit: 2018-04-06 | Discharge: 2018-04-06 | Disposition: A | Discharge status: Home / Self Care | Payer: 59 | Source: Ambulatory Visit | Attending: Family Medicine | Admitting: Family Medicine

## 2018-04-06 ENCOUNTER — Encounter: Payer: Self-pay | Admitting: Family Medicine

## 2018-04-06 VITALS — BP 124/78 | HR 68 | Temp 98.1°F | Ht 66.5 in | Wt 186.2 lb

## 2018-04-06 DIAGNOSIS — S61051D Open bite of right thumb without damage to nail, subsequent encounter: Secondary | ICD-10-CM | POA: Diagnosis not present

## 2018-04-06 DIAGNOSIS — S61051A Open bite of right thumb without damage to nail, initial encounter: Secondary | ICD-10-CM | POA: Insufficient documentation

## 2018-04-06 DIAGNOSIS — W5501XA Bitten by cat, initial encounter: Secondary | ICD-10-CM | POA: Insufficient documentation

## 2018-04-06 DIAGNOSIS — W5501XD Bitten by cat, subsequent encounter: Secondary | ICD-10-CM

## 2018-04-06 DIAGNOSIS — R2231 Localized swelling, mass and lump, right upper limb: Secondary | ICD-10-CM | POA: Diagnosis not present

## 2018-04-06 MED ORDER — AMOXICILLIN-POT CLAVULANATE 875-125 MG PO TABS: 1.0000 | ORAL_TABLET | Freq: Two times a day (BID) | ORAL | 0 refills | Status: DC

## 2018-04-06 NOTE — Progress Notes (Signed)
Subjective:    Patient ID: Joann Davis, female    DOB: 1970-07-18, 48 y.o.   MRN: [id number]  HPI Here for c/o a cat bite  Wt Readings from Last 3 Encounters:  04/06/18 186 lb 3 oz (84.5 kg)  03/19/18 188 lb 4 oz (85.4 kg)  01/26/18 188 lb (85.3 kg)   29.60 kg/m   Last tetanus shot was  11/13 Tdap   She was seen originally by Dr Selena Batten on 3/9 for this  Bite on R thumb happened that day  Her own cat/ was having a seizure at the vet (hand got caught in her cat's mouth)  Puncture wounds noted  She was tx with augmentin   Overall so much improved  After 18 days just wanted it re checked  Still has some swelling / and harder to bend  Dead skin has sloughed off  No drainage since initial occurrence   Redness is pretty much gone  It is sore if she uses it for an extended time or if she hits it against something   No fever and feels ok   Xray of thumb today is re assuring: Dg Finger Thumb Right  Result Date: 04/06/2018 CLINICAL DATA:  residual swelling in right thumb after tx of a cat bite 18 days ago, overall improved with no redness and slight tenderness r/o osteomyelitis EXAM: RIGHT THUMB 2+V COMPARISON:  None. FINDINGS: There is no evidence of fracture or dislocation. No focal demineralization. There is no evidence of arthropathy or other focal bone abnormality. No radiodense foreign body or subcutaneous gas. IMPRESSION: Negative. Electronically Signed   By: Corlis Leak M.D.   On: 04/06/2018 08:51     Patient Active Problem List   Diagnosis Date Noted  . Cat bite of right thumb 04/06/2018  . Overweight with body mass index (BMI) 25.0-29.9 06/28/2017  . Routine general medical examination at a health care facility 11/20/2011  . MIGRAINE WITH AURA 02/11/2009  . GERD 10/24/2006   Past Medical History:  Diagnosis Date  . Elevated blood pressure reading without diagnosis of hypertension   . GERD (gastroesophageal reflux disease)   . Migraine with aura, without mention of  intractable migraine without mention of status migrainosus   . Pre-eclampsia   . Temporomandibular joint disorders, unspecified    History reviewed. No pertinent surgical history. Social History   Tobacco Use  . Smoking status: Never Smoker  . Smokeless tobacco: Never Used  Substance Use Topics  . Alcohol use: Yes    Alcohol/week: 0.0 standard drinks    Comment: Occasionally (light)  . Drug use: No   Family History  Problem Relation Age of Onset  . Stroke Father 39       x 2  . Diabetes type II Father   . Hypertension Mother   . Stroke Paternal Grandmother 82  . Diabetes Paternal Grandfather   . Breast cancer Maternal Grandmother   . Colon cancer Maternal Aunt   . Colon cancer Maternal Uncle    Allergies  Allergen Reactions  . Monistat [Miconazole]   . Tramadol     Nausea and vomiting   Current Outpatient Medications on File Prior to Visit  Medication Sig Dispense Refill  . ibuprofen (ADVIL,MOTRIN) 200 MG tablet Take 200 mg by mouth as needed.    . Multiple Vitamin (MULTIVITAMIN) tablet Take 1 tablet by mouth daily.    . pantoprazole (PROTONIX) 40 MG tablet Take 1 tablet (40 mg total) by mouth daily. 90 tablet  3   No current facility-administered medications on file prior to visit.     Review of Systems  Constitutional: Negative for activity change, appetite change, fatigue, fever and unexpected weight change.  HENT: Negative for congestion, ear pain, rhinorrhea, sinus pressure and sore throat.   Eyes: Negative for pain, redness and visual disturbance.  Respiratory: Negative for cough, shortness of breath and wheezing.   Cardiovascular: Negative for chest pain and palpitations.  Gastrointestinal: Negative for abdominal pain, blood in stool, constipation and diarrhea.  Endocrine: Negative for polydipsia and polyuria.  Musculoskeletal: Negative for arthralgias, back pain and myalgias.       Thumb is swollen/sore (soft tissue/not joint)  Skin: Positive for wound.  Negative for pallor and rash.  Allergic/Immunologic: Negative for environmental allergies.  Neurological: Negative for dizziness, syncope and headaches.  Hematological: Negative for adenopathy. Does not bruise/bleed easily.  Psychiatric/Behavioral: Negative for decreased concentration and dysphoric mood. The patient is not nervous/anxious.        Objective:   Physical Exam Constitutional:      General: She is not in acute distress.    Appearance: Normal appearance. She is normal weight. She is not ill-appearing.  HENT:     Mouth/Throat:     Mouth: Mucous membranes are moist.     Pharynx: Oropharynx is clear.  Eyes:     General: No scleral icterus. Neck:     Musculoskeletal: Normal range of motion. No muscular tenderness.  Musculoskeletal:        General: Swelling and tenderness present. No deformity.     Comments: At the palmar base of thumb- 3 healed puncture wounds with some peeling of skin surrounding No erythema  Mild swelling unable to fully flex thumb due to swelling Minimally tender  No fluctuance or drainage   Lymphadenopathy:     Cervical: No cervical adenopathy.  Skin:    General: Skin is warm and dry.     Coloration: Skin is not pale.     Findings: No erythema or rash.  Neurological:     Mental Status: She is alert.     Sensory: No sensory deficit.     Motor: No weakness.     Coordination: Coordination normal.  Psychiatric:        Mood and Affect: Mood normal.     Comments: Pleasant and talkative            Assessment & Plan:   Problem List Items Addressed This Visit      Other   Cat bite of right thumb - Primary    18 days ago s/p augmentin for 5 d course Overall much improved Some residual swelling making it hard to bend (nl neuro exam)  Check xray to assess for osteomyelitis or fracture : this came back normal (see report)  Will extend augmentin for 7 more days Nsaid/ice prn  Consider hand surg appt if needed       Relevant Orders   DG  Finger Thumb Right (Completed)

## 2018-04-06 NOTE — Patient Instructions (Signed)
I think you bite is healing but in light of residual swelling- let check an xray to be safe Try some ice to get swelling down (10 minutes at a time)  Ibuprofen if needed Take 7 more days of augmentin   Watch for increased redness /swelling/pain or change in sensation or fever   Keep [place] posted

## 2018-04-06 NOTE — Assessment & Plan Note (Addendum)
18 days ago s/p augmentin for 5 d course Overall much improved Some residual swelling making it hard to bend (nl neuro exam)  Check xray to assess for osteomyelitis or fracture : this came back normal (see report)  Will extend augmentin for 7 more days Nsaid/ice prn  Consider hand surg appt if needed

## 2018-06-12 ENCOUNTER — Ambulatory Visit (INDEPENDENT_AMBULATORY_CARE_PROVIDER_SITE_OTHER): Payer: 59 | Admitting: Family Medicine

## 2018-06-12 ENCOUNTER — Encounter: Payer: Self-pay | Admitting: Family Medicine

## 2018-06-12 DIAGNOSIS — L237 Allergic contact dermatitis due to plants, except food: Secondary | ICD-10-CM | POA: Diagnosis not present

## 2018-06-12 MED ORDER — PREDNISONE 10 MG PO TABS: ORAL_TABLET | ORAL | 0 refills | Status: DC

## 2018-06-12 NOTE — Patient Instructions (Signed)
Keep rash as clean and dry as possibly  Ivy rest or calamine is fine as well  Keep nails short and try not to scratch Take prednisone as directed (it may make you hyper and hungry)  Try an antihistamine like zyrtec for itching (caution of sedation)   Update if not starting to improve in a week or if worsening   Do try to wipe down shoes/wear protective clothing if outdoors and possibly exposed  Wash all clothing immediately that may come in contact with plant oil Wash hands frequently

## 2018-06-12 NOTE — Progress Notes (Signed)
Virtual Visit via Video Note  I connected with Drucella H Mrozek on 06/12/18 at 11:00 AM EDT by a video enabled telemedicine application and verified that I am speaking with the correct person using two identifiers.  Location: Patient: home Provider: office    I discussed the limitations of evaluation and management by telemedicine and the availability of in person appointments. The patient expressed understanding and agreed to proceed.  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  HENT: Negative for sore throat.   Eyes: Negative for discharge and redness.  Respiratory: Negative for cough, shortness of breath and wheezing.   Cardiovascular: Negative for chest pain, palpitations and leg swelling.  Musculoskeletal: Negative for myalgias.  Skin: Positive for rash.  Neurological: Negative for dizziness and headaches.  Endo/Heme/Allergies: Positive for environmental allergies.    Patient Active Problem List   Diagnosis Date Noted  . Cat bite of right thumb 04/06/2018  . Overweight with body mass index (BMI) 25.0-29.9 06/28/2017  . Routine general medical examination at a health care facility 11/20/2011  . MIGRAINE WITH AURA 02/11/2009  . GERD 10/24/2006   Past Medical History:  Diagnosis Date  . Elevated blood pressure reading without diagnosis of hypertension   . GERD (gastroesophageal reflux disease)   . Migraine with aura, without mention of intractable migraine without mention of status migrainosus   . Pre-eclampsia   . Temporomandibular joint  disorders, unspecified    History reviewed. No pertinent surgical history. Social History   Tobacco Use  . Smoking status: Never Smoker  . Smokeless tobacco: Never Used  Substance Use Topics  . Alcohol use: Yes    Alcohol/week: 0.0 standard drinks    Comment: Occasionally (light)  . Drug use: No   Family History  Problem Relation Age of Onset  . Stroke Father 39       x 2  . Diabetes type II Father   . Hypertension Mother   . Stroke Paternal Grandmother 52  . Diabetes Paternal Grandfather   . Breast cancer Maternal Grandmother   . Colon cancer Maternal Aunt   . Colon cancer Maternal Uncle    Allergies  Allergen Reactions  . Monistat [Miconazole]   . Tramadol     Nausea and vomiting   Current Outpatient Medications on File Prior to Visit  Medication Sig Dispense Refill  . ibuprofen (ADVIL,MOTRIN) 200 MG tablet Take 200 mg by mouth as needed.    . Multiple Vitamin (MULTIVITAMIN) tablet Take 1 tablet by mouth daily.    . pantoprazole (PROTONIX) 40 MG tablet Take 1 tablet (40 mg total) by mouth daily. 90 tablet 3  . amoxicillin-clavulanate (AUGMENTIN) 875-125 MG tablet Take 1 tablet by mouth 2 (two) times daily. 14 tablet 0   No current facility-administered medications on file prior to visit.     Observations/Objective: Patient appears well, in no distress Weight is baseline  No facial swelling or asymmetry Normal voice-not hoarse and no slurred speech No obvious tremor or mobility impairment Moving neck and  UEs normally Able to hear the call well  No cough or shortness of breath during interview  Talkative and mentally sharp with no cognitive changes Vesicular erythematous rash in linear pattern seen on ant and post R wrist and forearm  No rash around eyes and no swelling noted  Affect is normal    Assessment and Plan: Problem List Items Addressed This Visit      Musculoskeletal and Integument   Plant allergic contact dermatitis    Pt suspects poison ivy  from walking in a field several times Vesicular rash on extremities and chin-very itchy Px prednisone 30 mg taper/disc poss side eff Recommend zyrtec 10 mg daily otc or other antihistamine for itch  Keep clean with soap and water/keep cool  Wash clothes/shoes exp to the plant oil  Update if not starting to improve in a week or if worsening            Follow Up Instructions: Keep rash as clean and dry as possibly  Ivy rest or calamine is fine as well  Keep nails short and try not to scratch Take prednisone as directed (it may make you hyper and hungry)  Try an antihistamine like zyrtec for itching (caution of sedation)   Update if not starting to improve in a week or if worsening   Do try to wipe down shoes/wear protective clothing if outdoors and possibly exposed  Wash all clothing immediately that may come in contact with plant oil Wash hands frequently    I discussed the assessment and treatment plan with the patient. The patient was provided an opportunity to ask questions and all were answered. The patient agreed with the plan and demonstrated an understanding of the instructions.   The patient was advised to call back or seek an in-person evaluation if the symptoms worsen or if the condition fails to improve as anticipated.    Roxy MannsMarne Nonnie Pickney, MD

## 2018-06-12 NOTE — Assessment & Plan Note (Signed)
Pt suspects poison ivy from walking in a field several times Vesicular rash on extremities and chin-very itchy Px prednisone 30 mg taper/disc poss side eff Recommend zyrtec 10 mg daily otc or other antihistamine for itch  Keep clean with soap and water/keep cool  Wash clothes/shoes exp to the plant oil  Update if not starting to improve in a week or if worsening

## 2018-07-08 ENCOUNTER — Telehealth: Payer: Self-pay | Admitting: Family Medicine

## 2018-07-08 DIAGNOSIS — Z0000 Encounter for general adult medical examination without abnormal findings: Secondary | ICD-10-CM

## 2018-07-08 NOTE — Telephone Encounter (Signed)
-----   Message from Ellamae Sia sent at 07/04/2018  3:17 PM EDT ----- Regarding: Lab orders for monday6.29.20 Patient is scheduled for CPX labs, please order future labs, Thanks , Karna Christmas

## 2018-07-09 ENCOUNTER — Other Ambulatory Visit (INDEPENDENT_AMBULATORY_CARE_PROVIDER_SITE_OTHER): Payer: 59

## 2018-07-09 ENCOUNTER — Other Ambulatory Visit: Payer: Self-pay

## 2018-07-09 DIAGNOSIS — Z0000 Encounter for general adult medical examination without abnormal findings: Secondary | ICD-10-CM | POA: Diagnosis not present

## 2018-07-09 LAB — CBC WITH DIFFERENTIAL/PLATELET
Basophils Absolute: 0 10*3/uL (ref 0.0–0.1)
Basophils Relative: 0.8 % (ref 0.0–3.0)
Eosinophils Absolute: 0.1 10*3/uL (ref 0.0–0.7)
Eosinophils Relative: 1.9 % (ref 0.0–5.0)
HCT: 36.3 % (ref 36.0–46.0)
Hemoglobin: 11.8 g/dL — ABNORMAL LOW (ref 12.0–15.0)
Lymphocytes Relative: 21.7 % (ref 12.0–46.0)
Lymphs Abs: 1.2 10*3/uL (ref 0.7–4.0)
MCHC: 32.4 g/dL (ref 30.0–36.0)
MCV: 88.1 fl (ref 78.0–100.0)
Monocytes Absolute: 0.6 10*3/uL (ref 0.1–1.0)
Monocytes Relative: 10.4 % (ref 3.0–12.0)
Neutro Abs: 3.6 10*3/uL (ref 1.4–7.7)
Neutrophils Relative %: 65.2 % (ref 43.0–77.0)
Platelets: 491 10*3/uL — ABNORMAL HIGH (ref 150.0–400.0)
RBC: 4.12 Mil/uL (ref 3.87–5.11)
RDW: 14.6 % (ref 11.5–15.5)
WBC: 5.5 10*3/uL (ref 4.0–10.5)

## 2018-07-09 LAB — COMPREHENSIVE METABOLIC PANEL
ALT: 13 U/L (ref 0–35)
AST: 13 U/L (ref 0–37)
Albumin: 3.8 g/dL (ref 3.5–5.2)
Alkaline Phosphatase: 60 U/L (ref 39–117)
BUN: 9 mg/dL (ref 6–23)
CO2: 25 mEq/L (ref 19–32)
Calcium: 8.9 mg/dL (ref 8.4–10.5)
Chloride: 104 mEq/L (ref 96–112)
Creatinine, Ser: 0.62 mg/dL (ref 0.40–1.20)
GFR: 102.49 mL/min (ref >60.00)
Glucose, Bld: 84 mg/dL (ref 70–99)
Potassium: 4.4 mEq/L (ref 3.5–5.1)
Sodium: 140 mEq/L (ref 135–145)
Total Bilirubin: 0.5 mg/dL (ref 0.2–1.2)
Total Protein: 6.1 g/dL (ref 6.0–8.3)

## 2018-07-09 LAB — LIPID PANEL
Cholesterol: 168 mg/dL (ref 0–200)
HDL: 40.1 mg/dL (ref >39.00)
LDL Cholesterol: 112 mg/dL — ABNORMAL HIGH (ref 0–99)
NonHDL: 128.33
Total CHOL/HDL Ratio: 4
Triglycerides: 84 mg/dL (ref 0.0–149.0)
VLDL: 16.8 mg/dL (ref 0.0–40.0)

## 2018-07-09 LAB — TSH: TSH: 1.73 u[IU]/mL (ref 0.35–4.50)

## 2018-07-18 ENCOUNTER — Other Ambulatory Visit: Payer: Self-pay

## 2018-07-18 ENCOUNTER — Ambulatory Visit (INDEPENDENT_AMBULATORY_CARE_PROVIDER_SITE_OTHER): Payer: 59 | Admitting: Family Medicine

## 2018-07-18 ENCOUNTER — Encounter: Payer: Self-pay | Admitting: Family Medicine

## 2018-07-18 VITALS — BP 108/76 | HR 74 | Temp 98.0°F | Ht 66.0 in | Wt 187.1 lb

## 2018-07-18 DIAGNOSIS — Z0000 Encounter for general adult medical examination without abnormal findings: Secondary | ICD-10-CM

## 2018-07-18 DIAGNOSIS — K219 Gastro-esophageal reflux disease without esophagitis: Secondary | ICD-10-CM

## 2018-07-18 DIAGNOSIS — E669 Obesity, unspecified: Secondary | ICD-10-CM

## 2018-07-18 NOTE — Assessment & Plan Note (Signed)
Continues to do well with ppi and diet control

## 2018-07-18 NOTE — Assessment & Plan Note (Signed)
Reviewed health habits including diet and exercise and skin cancer prevention Reviewed appropriate screening tests for age  Also reviewed health mt list, fam hx and immunization status , as well as social and family history   See HPI Labs reviewed  Disc strategy for cholesterol  Also exercise  Will get flu shot in the fall Sent for last mammogram and pap reports from gyn

## 2018-07-18 NOTE — Progress Notes (Signed)
Subjective:    Patient ID: Joann Davis, female    DOB: 12/21/1970, 48 y.o.   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  Just got an above ground pool- much more active - water walking with 49 year old  30.19 kg/m   Mammogram 02/23/18 -normal per pt  Self breast exam - no lumps   Flu shot 10/19 Tdap 11/13  Pap 1/18 neg with neg HPV screen  Had gyn visit 214/20   BP Readings from Last 3 Encounters:  07/18/18 108/76  04/06/18 124/78  03/19/18 114/78   Pulse Readings from Last 3 Encounters:  07/18/18 74  04/06/18 68  03/19/18 88     Pt has h/o thrombocytosis on and off Lab Results  Component Value Date   WBC 5.5 07/09/2018   HGB 11.8 (L) 07/09/2018   HCT 36.3 07/09/2018   MCV 88.1 07/09/2018   PLT 491.0 (H) 07/09/2018  donated double rbc recently     Other labs Lab Results  Component Value Date   CREATININE 0.62 07/09/2018   BUN 9 07/09/2018   NA 140 07/09/2018   K 4.4 07/09/2018   CL 104 07/09/2018   CO2 25 07/09/2018   Lab Results  Component Value Date   ALT 13 07/09/2018   AST 13 07/09/2018   ALKPHOS 60 07/09/2018   BILITOT 0.5 07/09/2018   Lab Results  Component Value Date   TSH 1.73 07/09/2018     Cholesterol Lab Results  Component Value Date   CHOL 168 07/09/2018   CHOL 169 10/05/2016   CHOL 148 03/27/2015   Lab Results  Component Value Date   HDL 40.10 07/09/2018   HDL 41.80 10/05/2016   HDL 34.50 (L) 03/27/2015   Lab Results  Component Value Date   LDLCALC 112 (H) 07/09/2018   LDLCALC 107 (H) 10/05/2016   LDLCALC 100  (H) 03/27/2015   Lab Results  Component Value Date   TRIG 84.0 07/09/2018   TRIG 100.0 10/05/2016   TRIG 68.0 03/27/2015   Lab Results  Component Value Date   CHOLHDL 4 07/09/2018   CHOLHDL 4 10/05/2016   CHOLHDL 4 03/27/2015   No results found for: LDLDIRECT LDL is up a [place] bit with current diet   Patient Active Problem List   Diagnosis Date Noted  . Obesity (BMI 30-39.9) 06/28/2017  . Routine general medical examination at a health care facility 11/20/2011  . MIGRAINE WITH AURA 02/11/2009  . GERD 10/24/2006   Past Medical History:  Diagnosis Date  . Elevated blood pressure reading without diagnosis of hypertension   . GERD (gastroesophageal reflux disease)   . Migraine with aura, without mention of intractable migraine without mention of status migrainosus   . Pre-eclampsia   . Temporomandibular joint disorders, unspecified    History reviewed. No pertinent surgical history. Social History   Tobacco Use  . Smoking status: Never Smoker  . Smokeless tobacco: Never Used  Substance Use Topics  . Alcohol use: Yes  Alcohol/week: 0.0 standard drinks    Comment: Occasionally (light)  . Drug use: No   Family History  Problem Relation Age of Onset  . Stroke Father 39       x 2  . Diabetes type II Father   . Hypertension Mother   . Stroke Paternal Grandmother 52  . Diabetes Paternal Grandfather   . Breast cancer Maternal Grandmother   . Colon cancer Maternal Aunt   . Colon cancer Maternal Uncle    Allergies  Allergen Reactions  . Monistat [Miconazole]   . Tramadol     Nausea and vomiting   Current Outpatient Medications on File Prior to Visit  Medication Sig Dispense Refill  . ibuprofen (ADVIL,MOTRIN) 200 MG tablet Take 200 mg by mouth as needed.    . Multiple Vitamin (MULTIVITAMIN) tablet Take 1 tablet by mouth daily.    . pantoprazole (PROTONIX) 40 MG tablet Take 1 tablet (40 mg total) by mouth daily. 90 tablet 3   No current facility-administered  medications on file prior to visit.     Review of Systems  Constitutional: Negative for activity change, appetite change, fatigue, fever and unexpected weight change.       Gave blood recently-double red cells  HENT: Negative for congestion, ear pain, rhinorrhea, sinus pressure and sore throat.   Eyes: Negative for pain, redness and visual disturbance.  Respiratory: Negative for cough, shortness of breath and wheezing.   Cardiovascular: Negative for chest pain and palpitations.  Gastrointestinal: Negative for abdominal pain, blood in stool, constipation and diarrhea.  Endocrine: Negative for polydipsia and polyuria.  Genitourinary: Negative for dysuria, frequency and urgency.  Musculoskeletal: Negative for arthralgias, back pain and myalgias.  Skin: Negative for pallor and rash.  Allergic/Immunologic: Negative for environmental allergies.  Neurological: Negative for dizziness, syncope and headaches.  Hematological: Negative for adenopathy. Does not bruise/bleed easily.  Psychiatric/Behavioral: Negative for decreased concentration and dysphoric mood. The patient is not nervous/anxious.        Objective:   Physical Exam Constitutional:      General: She is not in acute distress.    Appearance: Normal appearance. She is well-developed. She is obese. She is not ill-appearing or diaphoretic.  HENT:     Head: Normocephalic and atraumatic.     Right Ear: Tympanic membrane, ear canal and external ear normal.     Left Ear: Tympanic membrane, ear canal and external ear normal.     Nose: Nose normal.     Mouth/Throat:     Mouth: Mucous membranes are moist.     Pharynx: Oropharynx is clear. No posterior oropharyngeal erythema.  Eyes:     General: No scleral icterus.       Right eye: No discharge.        Left eye: No discharge.     Conjunctiva/sclera: Conjunctivae normal.     Pupils: Pupils are equal, round, and reactive to light.  Neck:     Musculoskeletal: Normal range of motion and  neck supple. No neck rigidity or muscular tenderness.     Thyroid: No thyromegaly.     Vascular: No carotid bruit or JVD.  Cardiovascular:     Rate and Rhythm: Normal rate and regular rhythm.     Pulses: Normal pulses.     Heart sounds: Normal heart sounds. No gallop.   Pulmonary:     Effort: Pulmonary effort is normal. No respiratory distress.     Breath sounds: Normal breath sounds. No wheezing or rales.  Comments: Good air exch Abdominal:     General: Bowel sounds are normal. There is no distension.     Palpations: Abdomen is soft. There is no mass.     Tenderness: There is no abdominal tenderness.  Musculoskeletal:        General: No tenderness.     Right lower leg: No edema.     Left lower leg: No edema.  Lymphadenopathy:     Cervical: No cervical adenopathy.  Skin:    General: Skin is warm and dry.     Coloration: Skin is not pale.     Findings: No erythema or rash.     Comments: Solar lentigines diffusely   Neurological:     Mental Status: She is alert. Mental status is at baseline.     Cranial Nerves: No cranial nerve deficit.     Motor: No abnormal muscle tone.     Coordination: Coordination normal.     Gait: Gait normal.     Deep Tendon Reflexes: Reflexes are normal and symmetric. Reflexes normal.  Psychiatric:        Mood and Affect: Mood normal.        Cognition and Memory: Cognition and memory normal.     Comments: Cheerful and talkative            Assessment & Plan:   Problem List Items Addressed This Visit      Digestive   GERD    Continues to do well with ppi and diet control         Other   Routine general medical examination at a health care facility - Primary    Reviewed health habits including diet and exercise and skin cancer prevention Reviewed appropriate screening tests for age  Also reviewed health mt list, fam hx and immunization status , as well as social and family history   See HPI Labs reviewed  Disc strategy for  cholesterol  Also exercise  Will get flu shot in the fall Sent for last mammogram and pap reports from gyn        Obesity (BMI 30-39.9)    Discussed how this problem influences overall health and the risks it imposes  Reviewed plan for weight loss with lower calorie diet (via better food choices and also portion control or program like weight watchers) and exercise building up to or more than 30 minutes 5 days per week including some aerobic activity   Enc better food choices  Enc swimming Also consider home exercise program

## 2018-07-18 NOTE — Patient Instructions (Signed)
Take care of yourself  Fit in exercise when you can   For cholesterol Avoid red meat/ fried foods/ egg yolks/ fatty breakfast meats/ butter, cheese and high fat dairy/ and shellfish    Wear sunscreen when outdoors   Get a flu shot in the fall

## 2018-07-18 NOTE — Assessment & Plan Note (Signed)
Discussed how this problem influences overall health and the risks it imposes  Reviewed plan for weight loss with lower calorie diet (via better food choices and also portion control or program like weight watchers) and exercise building up to or more than 30 minutes 5 days per week including some aerobic activity   Enc better food choices  Enc swimming Also consider home exercise program

## 2019-02-07 ENCOUNTER — Other Ambulatory Visit: Payer: Self-pay | Admitting: Family Medicine

## 2019-04-23 IMAGING — DX RIGHT THUMB 2+V
3 series · 3 of 3 positions shown · non-contrast
Comparison: None.

CLINICAL DATA: residual swelling in right thumb after tx of a cat
bite 18 days ago, overall improved with no redness and slight
tenderness r/o osteomyelitis

EXAM:
RIGHT THUMB 2+V

[finger ap]
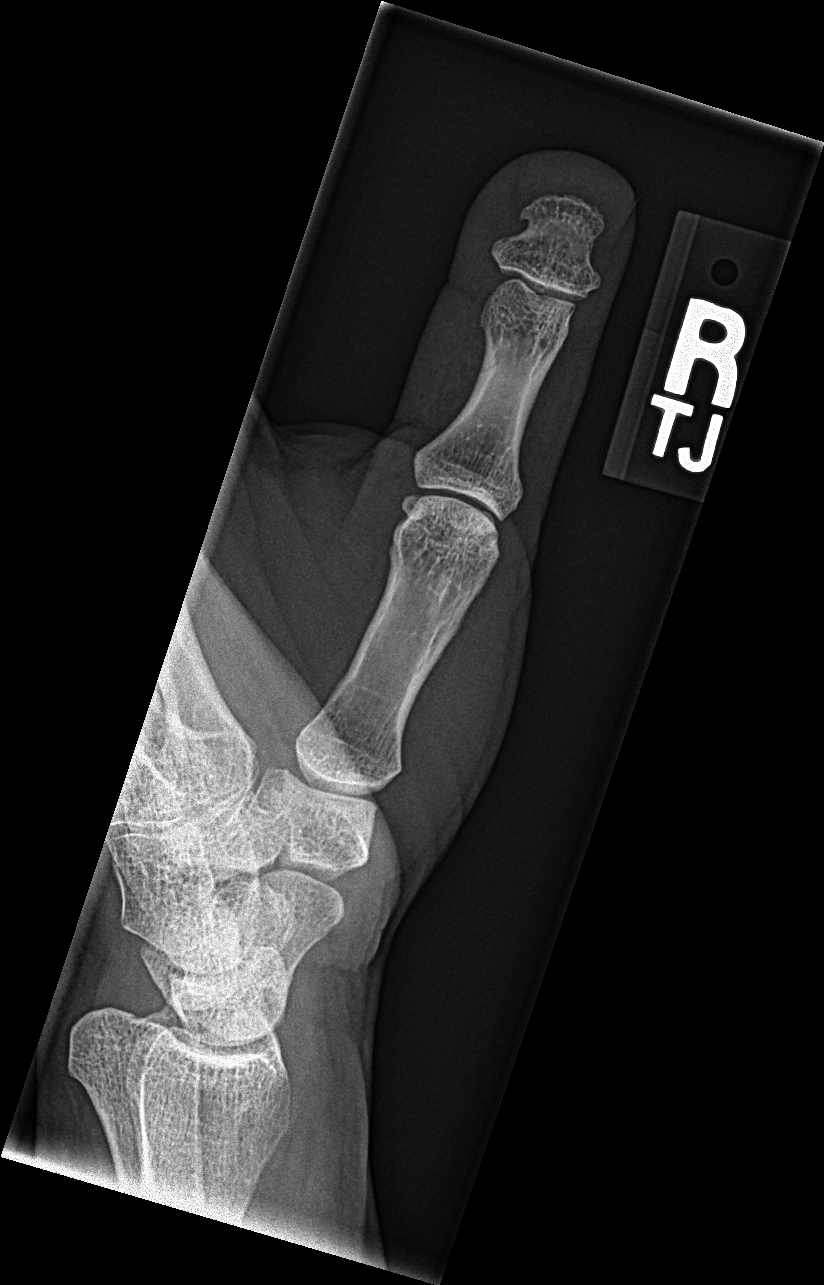

[finger obl]
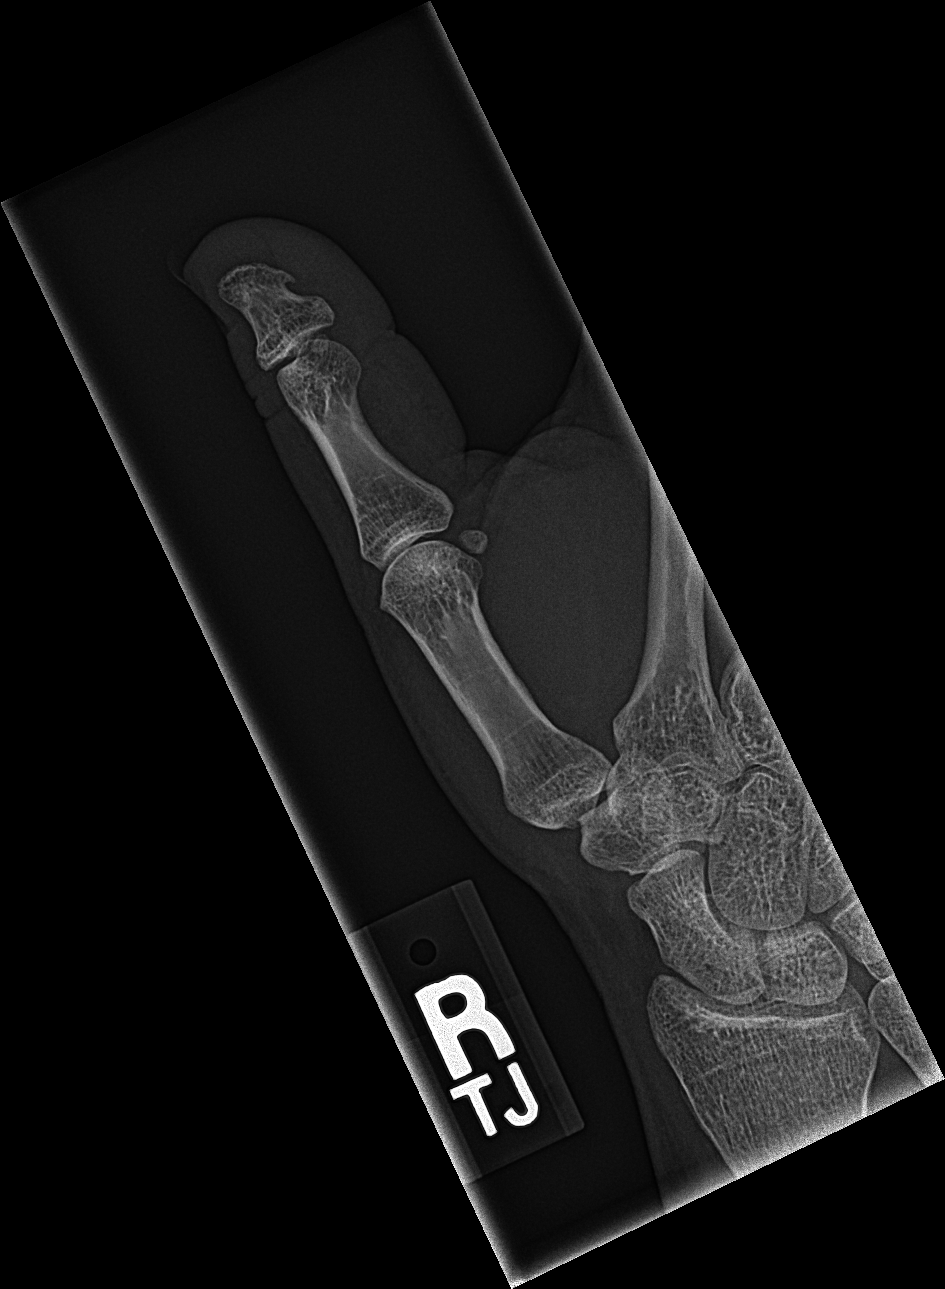

[finger lat]
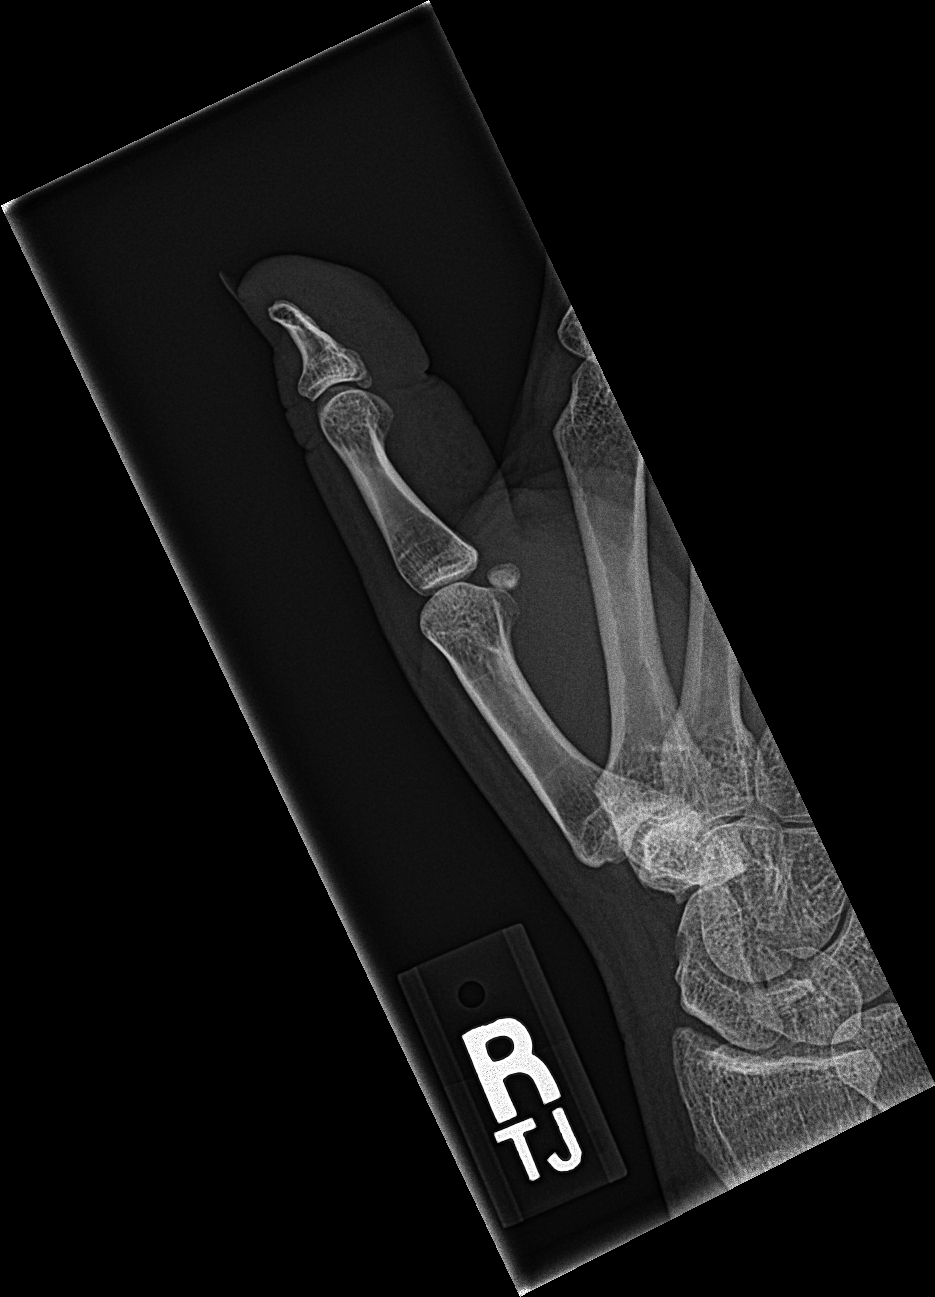

[3 of 3 positions shown; findings below may reference images not displayed]

FINDINGS: There is no evidence of fracture or dislocation. No focal
demineralization. There is no evidence of arthropathy or other focal
bone abnormality. No radiodense foreign body or subcutaneous gas.
IMPRESSION: Negative.

## 2019-05-25 LAB — HM PAP SMEAR: HPV, high-risk: NEGATIVE

## 2019-07-19 ENCOUNTER — Other Ambulatory Visit: Payer: Self-pay | Admitting: Family Medicine

## 2019-07-22 NOTE — Telephone Encounter (Signed)
Pt hasn't been seen in over a year and no future appts., please advise  

## 2019-07-22 NOTE — Telephone Encounter (Signed)
Please schedule f/u or PE and refill unti then

## 2019-07-24 NOTE — Telephone Encounter (Signed)
Left VM requesting pt to call the office back 

## 2019-08-04 ENCOUNTER — Other Ambulatory Visit: Payer: Self-pay | Admitting: Family Medicine

## 2019-08-05 ENCOUNTER — Telehealth: Payer: Self-pay | Admitting: Family Medicine

## 2019-08-05 DIAGNOSIS — Z0000 Encounter for general adult medical examination without abnormal findings: Secondary | ICD-10-CM

## 2019-08-05 NOTE — Telephone Encounter (Signed)
-----   Message from Aquilla Solian, RT sent at 08/02/2019 11:14 AM EDT ----- Regarding: Lab Orders for Tuesday 7.27.2021 Please place lab orders for Tuesday 7.27.2021, office visit for physical on Wednesday 8.4.2021 Thank you, Jones Bales RT(R)

## 2019-08-06 ENCOUNTER — Other Ambulatory Visit (INDEPENDENT_AMBULATORY_CARE_PROVIDER_SITE_OTHER): Payer: 59

## 2019-08-06 ENCOUNTER — Other Ambulatory Visit: Payer: Self-pay

## 2019-08-06 DIAGNOSIS — Z0000 Encounter for general adult medical examination without abnormal findings: Secondary | ICD-10-CM | POA: Diagnosis not present

## 2019-08-06 LAB — LIPID PANEL
Cholesterol: 192 mg/dL (ref 0–200)
HDL: 36.8 mg/dL — ABNORMAL LOW (ref >39.00)
LDL Cholesterol: 128 mg/dL — ABNORMAL HIGH (ref 0–99)
NonHDL: 155.03
Total CHOL/HDL Ratio: 5
Triglycerides: 136 mg/dL (ref 0.0–149.0)
VLDL: 27.2 mg/dL (ref 0.0–40.0)

## 2019-08-06 LAB — COMPREHENSIVE METABOLIC PANEL
ALT: 16 U/L (ref 0–35)
AST: 16 U/L (ref 0–37)
Albumin: 4.1 g/dL (ref 3.5–5.2)
Alkaline Phosphatase: 68 U/L (ref 39–117)
BUN: 8 mg/dL (ref 6–23)
CO2: 29 mEq/L (ref 19–32)
Calcium: 9.2 mg/dL (ref 8.4–10.5)
Chloride: 103 mEq/L (ref 96–112)
Creatinine, Ser: 0.55 mg/dL (ref 0.40–1.20)
GFR: 117.16 mL/min (ref >60.00)
Glucose, Bld: 95 mg/dL (ref 70–99)
Potassium: 4.1 mEq/L (ref 3.5–5.1)
Sodium: 138 mEq/L (ref 135–145)
Total Bilirubin: 0.3 mg/dL (ref 0.2–1.2)
Total Protein: 6.9 g/dL (ref 6.0–8.3)

## 2019-08-06 LAB — CBC WITH DIFFERENTIAL/PLATELET
Basophils Absolute: 0 10*3/uL (ref 0.0–0.1)
Basophils Relative: 0.7 % (ref 0.0–3.0)
Eosinophils Absolute: 0.1 10*3/uL (ref 0.0–0.7)
Eosinophils Relative: 1.6 % (ref 0.0–5.0)
HCT: 39.4 % (ref 36.0–46.0)
Hemoglobin: 13.2 g/dL (ref 12.0–15.0)
Lymphocytes Relative: 25.5 % (ref 12.0–46.0)
Lymphs Abs: 1.6 10*3/uL (ref 0.7–4.0)
MCHC: 33.4 g/dL (ref 30.0–36.0)
MCV: 87.8 fl (ref 78.0–100.0)
Monocytes Absolute: 0.6 10*3/uL (ref 0.1–1.0)
Monocytes Relative: 8.9 % (ref 3.0–12.0)
Neutro Abs: 3.9 10*3/uL (ref 1.4–7.7)
Neutrophils Relative %: 63.3 % (ref 43.0–77.0)
Platelets: 445 10*3/uL — ABNORMAL HIGH (ref 150.0–400.0)
RBC: 4.49 Mil/uL (ref 3.87–5.11)
RDW: 13 % (ref 11.5–15.5)
WBC: 6.2 10*3/uL (ref 4.0–10.5)

## 2019-08-06 LAB — TSH: TSH: 0.73 u[IU]/mL (ref 0.35–4.50)

## 2019-08-14 ENCOUNTER — Other Ambulatory Visit: Payer: Self-pay

## 2019-08-14 ENCOUNTER — Encounter: Payer: Self-pay | Admitting: Family Medicine

## 2019-08-14 ENCOUNTER — Ambulatory Visit (INDEPENDENT_AMBULATORY_CARE_PROVIDER_SITE_OTHER): Payer: 59 | Admitting: Family Medicine

## 2019-08-14 VITALS — BP 122/68 | HR 71 | Temp 96.9°F | Ht 66.5 in | Wt 193.5 lb

## 2019-08-14 DIAGNOSIS — E7800 Pure hypercholesterolemia, unspecified: Secondary | ICD-10-CM | POA: Diagnosis not present

## 2019-08-14 DIAGNOSIS — Z0000 Encounter for general adult medical examination without abnormal findings: Secondary | ICD-10-CM

## 2019-08-14 DIAGNOSIS — E785 Hyperlipidemia, unspecified: Secondary | ICD-10-CM | POA: Insufficient documentation

## 2019-08-14 DIAGNOSIS — E669 Obesity, unspecified: Secondary | ICD-10-CM

## 2019-08-14 NOTE — Assessment & Plan Note (Signed)
With family history of vascular dz  Disc goals Disc goals for lipids and reasons to control them Rev last labs with pt Rev low sat fat diet in detail Info given-will work on diet first May need to discuss statin later

## 2019-08-14 NOTE — Assessment & Plan Note (Addendum)
Reviewed health habits including diet and exercise and skin cancer prevention Reviewed appropriate screening tests for age  Also reviewed health mt list, fam hx and immunization status , as well as social and family history   See HPI Enc wt loss with lower carb diet/more produce and exercise  Disc plan for cholesterol Sent for last mammo/US report from gyn as well as last colonoscopy report from GI  Vaccinated for covid  Can discuss shingrix once she turns 49  Disc importance of ca and D for bone health-especially after menopause

## 2019-08-14 NOTE — Assessment & Plan Note (Signed)
Discussed how this problem influences overall health and the risks it imposes  Reviewed plan for weight loss with lower calorie diet (via better food choices and also portion control or program like weight watchers) and exercise building up to or more than 30 minutes 5 days per week including some aerobic activity    

## 2019-08-14 NOTE — Patient Instructions (Addendum)
Try to get most of your carbohydrates from produce (with the exception of white potatoes)  Eat less bread/pasta/rice/snack foods/cereals/sweets and other items from the middle of the grocery store (processed carbs)  Think about more regular exercise   Stop up front to send for you mammogram and colonoscopy report   For cholesterol  Avoid red meat/ fried foods/ egg yolks/ fatty breakfast meats/ butter, cheese and high fat dairy/ and shellfish     Try to get 1200-1500 mg of calcium per day with at least 1000 iu of vitamin D - for bone health

## 2019-08-14 NOTE — Progress Notes (Signed)
Subjective:    Patient ID: Joann Davis, female    DOB: 13-Dec-1970, 49 y.o.   MRN: [id number]  This visit occurred during the SARS-CoV-2 public health emergency.  Safety protocols were in place, including screening questions prior to the visit, additional usage of staff PPE, and extensive cleaning of exam room while observing appropriate contact time as indicated for disinfecting solutions.    HPI Here for health maintenance exam and to review chronic medical problems    Wt Readings from Last 3 Encounters:  08/14/19 193 lb 8 oz (87.8 kg)  07/18/18 187 lb 1 oz (84.9 kg)  04/06/18 186 lb 3 oz (84.5 kg)   30.76 kg/m   Doing well  Has a new puppy -enjoying it  More active - no formal exercise  Outdoors caring for animals/ chickens and goat   Feeling pretty good  Taking fair care of herself   Eating- is about the same  Needs more vegetables  Much less fried food   Hep C screen   covid status -vaccinated  Pap 1/18 -neg Had a recent gyn visit   Mammogram - 3/21-had to have recall ultrasound-was ok  Self breast exam -no lumps   Had her first colonoscopy a month -was ok /one polyp was removed  Short recall - ? 3 years  Aunt and uncle with colon cancer  Mother has polyps   Flu shot - will get in the fall  Tdap 11/13  BP Readings from Last 3 Encounters:  08/14/19 122/68  07/18/18 108/76  04/06/18 124/78   Pulse Readings from Last 3 Encounters:  08/14/19 71  07/18/18 74  04/06/18 68   Cholesterol  Lab Results  Component Value Date   CHOL 192 08/06/2019   CHOL 168 07/09/2018   CHOL 169 10/05/2016   Lab Results  Component Value Date   HDL 36.80 (L) 08/06/2019   HDL 40.10 07/09/2018   HDL 41.80 10/05/2016   Lab Results  Component Value Date   LDLCALC 128 (H) 08/06/2019   LDLCALC 112 (H) 07/09/2018   LDLCALC 107 (H) 10/05/2016   Lab Results  Component Value Date   TRIG 136.0 08/06/2019   TRIG 84.0 07/09/2018   TRIG 100.0 10/05/2016   Lab Results    Component Value Date   CHOLHDL 5 08/06/2019   CHOLHDL 4 07/09/2018   CHOLHDL 4 10/05/2016   No results found for: LDLDIRECT  HDL is down and LDL is up   Mother -heart issues  Father- CVA   Lab Results  Component Value Date   CREATININE 0.55 08/06/2019   BUN 8 08/06/2019   NA 138 08/06/2019   K 4.1 08/06/2019   CL 103 08/06/2019   CO2 29 08/06/2019   Lab Results  Component Value Date   ALT 16 08/06/2019   AST 16 08/06/2019   ALKPHOS 68 08/06/2019   BILITOT 0.3 08/06/2019    Lab Results  Component Value Date   WBC 6.2 08/06/2019   HGB 13.2 08/06/2019   HCT 39.4 08/06/2019   MCV 87.8 08/06/2019   PLT 445.0 (H) 08/06/2019   Lab Results  Component Value Date   TSH 0.73 08/06/2019    Patient Active Problem List   Diagnosis Date Noted  . Obesity (BMI 30-39.9) 06/28/2017  . Routine general medical examination at a health care facility 11/20/2011  . MIGRAINE WITH AURA 02/11/2009  . GERD 10/24/2006   Past Medical History:  Diagnosis Date  . Elevated blood pressure reading without diagnosis  of hypertension   . GERD (gastroesophageal reflux disease)   . Migraine with aura, without mention of intractable migraine without mention of status migrainosus   . Pre-eclampsia   . Temporomandibular joint disorders, unspecified    History reviewed. No pertinent surgical history. Social History   Tobacco Use  . Smoking status: Never Smoker  . Smokeless tobacco: Never Used  Substance Use Topics  . Alcohol use: Yes    Alcohol/week: 0.0 standard drinks    Comment: Occasionally (light)  . Drug use: No   Family History  Problem Relation Age of Onset  . Stroke Father 39       x 2  . Diabetes type II Father   . Hypertension Mother   . Colonic polyp Mother   . Stroke Paternal Grandmother 47  . Diabetes Paternal Grandfather   . Breast cancer Maternal Grandmother   . Colon cancer Maternal Aunt   . Colon cancer Maternal Uncle    Allergies  Allergen Reactions  .  Monistat [Miconazole]   . Tramadol     Nausea and vomiting   Current Outpatient Medications on File Prior to Visit  Medication Sig Dispense Refill  . ibuprofen (ADVIL,MOTRIN) 200 MG tablet Take 200 mg by mouth as needed.    . Multiple Vitamin (MULTIVITAMIN) tablet Take 1 tablet by mouth daily.    . pantoprazole (PROTONIX) 40 MG tablet TAKE 1 TABLET BY MOUTH  DAILY 90 tablet 1   No current facility-administered medications on file prior to visit.     Review of Systems  Constitutional: Negative for activity change, appetite change, fatigue, fever and unexpected weight change.  HENT: Negative for congestion, ear pain, rhinorrhea, sinus pressure and sore throat.   Eyes: Negative for pain, redness and visual disturbance.  Respiratory: Negative for cough, shortness of breath and wheezing.   Cardiovascular: Negative for chest pain and palpitations.  Gastrointestinal: Negative for abdominal pain, blood in stool, constipation and diarrhea.  Endocrine: Negative for polydipsia and polyuria.  Genitourinary: Negative for dysuria, frequency and urgency.  Musculoskeletal: Negative for arthralgias, back pain and myalgias.  Skin: Negative for pallor and rash.  Allergic/Immunologic: Negative for environmental allergies.  Neurological: Negative for dizziness, syncope and headaches.  Hematological: Negative for adenopathy. Does not bruise/bleed easily.  Psychiatric/Behavioral: Negative for decreased concentration and dysphoric mood. The patient is not nervous/anxious.        Objective:   Physical Exam Constitutional:      General: She is not in acute distress.    Appearance: Normal appearance. She is well-developed. She is obese. She is not ill-appearing or diaphoretic.  HENT:     Head: Normocephalic and atraumatic.     Right Ear: Tympanic membrane, ear canal and external ear normal.     Left Ear: Tympanic membrane, ear canal and external ear normal.     Nose: Nose normal. No congestion.      Mouth/Throat:     Mouth: Mucous membranes are moist.     Pharynx: Oropharynx is clear. No posterior oropharyngeal erythema.  Eyes:     General: No scleral icterus.    Extraocular Movements: Extraocular movements intact.     Conjunctiva/sclera: Conjunctivae normal.     Pupils: Pupils are equal, round, and reactive to light.  Neck:     Thyroid: No thyromegaly.     Vascular: No carotid bruit or JVD.  Cardiovascular:     Rate and Rhythm: Normal rate and regular rhythm.     Pulses: Normal pulses.  Heart sounds: Normal heart sounds. No gallop.   Pulmonary:     Effort: Pulmonary effort is normal. No respiratory distress.     Breath sounds: Normal breath sounds. No wheezing.     Comments: Good air exch Chest:     Chest wall: No tenderness.  Abdominal:     General: Bowel sounds are normal. There is no distension or abdominal bruit.     Palpations: Abdomen is soft. There is no mass.     Tenderness: There is no abdominal tenderness.     Hernia: No hernia is present.  Genitourinary:    Comments: Breast and pelvic exam are done by gyn Musculoskeletal:        General: No tenderness. Normal range of motion.     Cervical back: Normal range of motion and neck supple. No rigidity. No muscular tenderness.     Right lower leg: No edema.     Left lower leg: No edema.  Lymphadenopathy:     Cervical: No cervical adenopathy.  Skin:    General: Skin is warm and dry.     Coloration: Skin is not pale.     Findings: No erythema or rash.     Comments: Solar lentigines diffusely Small skin tag R inner thigh  Small brown nevus consistent with dermatofibroma close to that   Neurological:     Mental Status: She is alert. Mental status is at baseline.     Cranial Nerves: No cranial nerve deficit.     Motor: No abnormal muscle tone.     Coordination: Coordination normal.     Gait: Gait normal.     Deep Tendon Reflexes: Reflexes are normal and symmetric. Reflexes normal.  Psychiatric:         Attention and Perception: Attention normal.        Mood and Affect: Mood normal.        Cognition and Memory: Cognition and memory normal.           Assessment & Plan:   Problem List Items Addressed This Visit      Other   Routine general medical examination at a health care facility - Primary    Reviewed health habits including diet and exercise and skin cancer prevention Reviewed appropriate screening tests for age  Also reviewed health mt list, fam hx and immunization status , as well as social and family history   See HPI Enc wt loss with lower carb diet/more produce and exercise  Disc plan for cholesterol Sent for last mammo/US report from gyn as well as last colonoscopy report from GI  Vaccinated for covid  Can discuss shingrix once she turns 49  Disc importance of ca and D for bone health-especially after menopause         Obesity (BMI 30-39.9)    Discussed how this problem influences overall health and the risks it imposes  Reviewed plan for weight loss with lower calorie diet (via better food choices and also portion control or program like weight watchers) and exercise building up to or more than 30 minutes 5 days per week including some aerobic activity         Hyperlipidemia    With family history of vascular dz  Disc goals Disc goals for lipids and reasons to control them Rev last labs with pt Rev low sat fat diet in detail Info given-will work on diet first May need to discuss statin later

## 2019-12-24 ENCOUNTER — Other Ambulatory Visit: Payer: Self-pay | Admitting: Family Medicine

## 2020-02-24 LAB — HM MAMMOGRAPHY: HM Mammogram: NORMAL (ref 0–4)

## 2020-03-24 ENCOUNTER — Telehealth (INDEPENDENT_AMBULATORY_CARE_PROVIDER_SITE_OTHER): Payer: 59 | Admitting: Family Medicine

## 2020-03-24 ENCOUNTER — Other Ambulatory Visit: Payer: Self-pay

## 2020-03-24 ENCOUNTER — Encounter: Payer: Self-pay | Admitting: Family Medicine

## 2020-03-24 VITALS — Wt 190.0 lb

## 2020-03-24 DIAGNOSIS — J069 Acute upper respiratory infection, unspecified: Secondary | ICD-10-CM | POA: Diagnosis not present

## 2020-03-24 DIAGNOSIS — J0140 Acute pansinusitis, unspecified: Secondary | ICD-10-CM

## 2020-03-24 MED ORDER — AMOXICILLIN-POT CLAVULANATE 875-125 MG PO TABS: 1.0000 | ORAL_TABLET | Freq: Two times a day (BID) | ORAL | 0 refills | Status: AC

## 2020-03-24 MED ORDER — BENZONATATE 100 MG PO CAPS: 100.0000 mg | ORAL_CAPSULE | Freq: Three times a day (TID) | ORAL | 0 refills | Status: DC | PRN

## 2020-03-24 NOTE — Progress Notes (Signed)
I connected with Alyss H Sarvis on 03/24/20 at  8:20 AM EDT by video and verified that I am speaking with the correct person using two identifiers.   I discussed the limitations, risks, security and privacy concerns of performing an evaluation and management service by video and the availability of in person appointments. I also discussed with the patient that there may be a patient responsible charge related to this service. The patient expressed understanding and agreed to proceed.  Patient location: Home Provider Location:  [facility] Participants: Lynnda Child and Arline Asp H Fernando   Subjective:     Joann Davis is a 50 y.o. female presenting for Cough (Onset of sx 3/5. Tested for covid on 3/7, negative result), Sore Throat (Not really sore, but feels like something is in her throat), and Nasal Congestion     Cough This is a new problem. Episode onset: 03/14/2020. The problem has been unchanged. The cough is non-productive. Associated symptoms include ear congestion, ear pain (occasional), nasal congestion and a sore throat (triggers the cough). Pertinent negatives include no chest pain, chills, fever, headaches, shortness of breath or wheezing. The symptoms are aggravated by cold air and fumes (tickle in throat). She has tried OTC cough suppressant for the symptoms. The treatment provided mild relief. There is no history of asthma or COPD.   Covid rapid on 3/7  No hx of allergies No loss of taste or smell    Review of Systems  Constitutional: Negative for chills and fever.  HENT: Positive for ear pain (occasional), sneezing and sore throat (triggers the cough). Negative for sinus pressure and sinus pain.   Respiratory: Positive for cough. Negative for shortness of breath and wheezing.   Cardiovascular: Negative for chest pain.  Gastrointestinal: Negative for diarrhea, nausea and vomiting.  Neurological: Negative for headaches.     Social History   Tobacco Use   Smoking Status Never Smoker  Smokeless Tobacco Never Used        Objective:   BP Readings from Last 3 Encounters:  08/14/19 122/68  07/18/18 108/76  04/06/18 124/78   Wt Readings from Last 3 Encounters:  03/24/20 190 lb (86.2 kg)  08/14/19 193 lb 8 oz (87.8 kg)  07/18/18 187 lb 1 oz (84.9 kg)    Wt 190 lb (86.2 kg)   LMP 03/20/2020 (Exact Date)   BMI 30.21 kg/m    Physical Exam Constitutional:      Appearance: Normal appearance. She is not ill-appearing.  HENT:     Head: Normocephalic and atraumatic.     Right Ear: External ear normal.     Left Ear: External ear normal.  Eyes:     Conjunctiva/sclera: Conjunctivae normal.  Pulmonary:     Effort: Pulmonary effort is normal. No respiratory distress.  Neurological:     Mental Status: She is alert. Mental status is at baseline.  Psychiatric:        Mood and Affect: Mood normal.        Behavior: Behavior normal.        Thought Content: Thought content normal.        Judgment: Judgment normal.            Assessment & Plan:   Problem List Items Addressed This Visit   None   Visit Diagnoses    Acute non-recurrent pansinusitis    -  Primary   Relevant Medications   amoxicillin-clavulanate (AUGMENTIN) 875-125 MG tablet   benzonatate (TESSALON PERLES) 100  MG capsule   Viral URI with cough       Relevant Medications   benzonatate (TESSALON PERLES) 100 MG capsule     Discussed likely sinus congestion triggering cough with sore throat  Advised 2 days of allergy pill and saline rinse  Abx for possible sinus infection if no improvement  Return if symptoms worsen or fail to improve.  Lynnda Child, MD

## 2020-03-24 NOTE — Patient Instructions (Addendum)
Based on your symptoms, it looks like you have a virus.   Antibiotics are not need for a viral infection but the following will help:   1. Drink plenty of fluids 2. Get lots of rest  Sinus Congestion 1) Neti Pot (Saline rinse) -- 2 times day -- if tolerated 2) Flonase (Store Brand ok) - once daily 3) Over the counter congestion medications 4) Try a daily allergy pill - claritin or zyrtec   Cough 1) Cough drops can be helpful 2) Nyquil (or nighttime cough medication) 3) Honey is proven to be one of the best cough medications  4) Cough medicine with Dextromethorphan can also be helpful  Sore Throat 1) Honey as above, cough drops 2) Ibuprofen or Aleve can be helpful 3) Salt water Gargles   If no improvement in 2 days start antibiotics

## 2020-04-10 ENCOUNTER — Telehealth: Payer: Self-pay

## 2020-04-10 MED ORDER — FLUCONAZOLE 150 MG PO TABS: 150.0000 mg | ORAL_TABLET | Freq: Once | ORAL | 0 refills | Status: AC

## 2020-04-10 NOTE — Telephone Encounter (Signed)
Left VM letting pt know Rx sent 

## 2020-04-10 NOTE — Telephone Encounter (Signed)
I sent it  Let me know if any problems  

## 2020-04-10 NOTE — Addendum Note (Signed)
Addended by: Roxy Manns A on: 04/10/2020 04:46 PM   Modules accepted: Orders

## 2020-04-10 NOTE — Telephone Encounter (Signed)
Pt did VV with Dr Selena Batten 03-24-20. Was put on an antibiotic. Now she has a yeast infection. Cannot use OTC yeast infection medications. Asking if Dr Milinda Antis would call in diflucan to [facility].   On lunch until 3:30 so you can call her on her cell. After 3:30 call pt at work at [phone] and ask for her directly.

## 2020-05-18 ENCOUNTER — Telehealth: Payer: Self-pay

## 2020-05-18 NOTE — Telephone Encounter (Signed)
Patient called and stated that she was attacked by a rooster on Friday and has two puncture marks in her calf. Patient wanted to know if she should get an updated tetanus shot? Her last Tdap was on 12/02/2011. Please advise.

## 2020-05-18 NOTE — Telephone Encounter (Signed)
Yes please update with Td when able.  Keep wound clean with soap and water and use abx ointment.  Watch for redness/swelling-if any signs of infection we will need to see for a visit

## 2020-05-18 NOTE — Telephone Encounter (Signed)
Left VM requesting pt to call the office back 

## 2020-05-19 ENCOUNTER — Encounter: Payer: Self-pay | Admitting: *Deleted

## 2020-05-19 NOTE — Telephone Encounter (Signed)
Pt is aware as instructed and expressed understanding---she states she may go to the pharmacy

## 2020-06-10 ENCOUNTER — Other Ambulatory Visit: Payer: Self-pay | Admitting: Family Medicine

## 2020-08-27 ENCOUNTER — Encounter: Payer: Self-pay | Admitting: Family Medicine

## 2020-08-27 ENCOUNTER — Telehealth (INDEPENDENT_AMBULATORY_CARE_PROVIDER_SITE_OTHER): Payer: 59 | Admitting: Family Medicine

## 2020-08-27 DIAGNOSIS — L255 Unspecified contact dermatitis due to plants, except food: Secondary | ICD-10-CM

## 2020-08-27 DIAGNOSIS — L237 Allergic contact dermatitis due to plants, except food: Secondary | ICD-10-CM | POA: Diagnosis not present

## 2020-08-27 MED ORDER — PREDNISONE 10 MG PO TABS: ORAL_TABLET | ORAL | 0 refills | Status: DC

## 2020-08-27 NOTE — Assessment & Plan Note (Signed)
Poison ivy exposure and rash began 3 days earlier  Mainly on arms/hands and L face  No signs of superinfection  Disc imp of keeping clean (soap and water) and dry  Triamcinolone cream prn  Px prednisone taper 30 mg  Disc poss side effect (has tolerated well in the past)  Update if not starting to improve in a week or if worsening  Plans to wash anything that contacted the plant

## 2020-08-27 NOTE — Patient Instructions (Signed)
Keep cool Keep rash clean and dry  Take prednisone as directed  Alert [place] if any intolerable side effects  Update if not starting to improve in a week or if worsening

## 2020-08-27 NOTE — Progress Notes (Signed)
Virtual Visit via Video Note  I connected with Joann Davis on 08/27/20 at 11:30 AM EDT by a video enabled telemedicine application and verified that I am speaking with the correct person using two identifiers.  Location: Patient: home Provider: office   I discussed the limitations of evaluation and management by telemedicine and the availability of in person appointments. The patient expressed understanding and agreed to proceed.  Parties involved in encounter  Patient: Joann Davis  Provider:  Roxy Manns MD   History of Present Illness: Presents for poison ivy dermatitis   She was exposed in some brush getting a cat  Wiped her face and did not realize it  She did wash off right away and changed closes  Used poison ivy wash as well  Washed clothing  Forgot to wash glasses   Took 3 days to start breaking out   Left arm/elbow worst Hands  Top of feet  On L eyelid   Using triamcinolone  Not helping much  Antihistamines - claritin and benadryl  Tries to stay cool  Ice pack   Had prednisone a month ago after fire ant bites and more poison ivy     Patient Active Problem List   Diagnosis Date Noted   Hyperlipidemia 08/14/2019   Obesity (BMI 30-39.9) 06/28/2017   Routine general medical examination at a health care facility 11/20/2011   MIGRAINE WITH AURA 02/11/2009   GERD 10/24/2006   Past Medical History:  Diagnosis Date   Elevated blood pressure reading without diagnosis of hypertension    GERD (gastroesophageal reflux disease)    Migraine with aura, without mention of intractable migraine without mention of status migrainosus    Pre-eclampsia    Temporomandibular joint disorders, unspecified    History reviewed. No pertinent surgical history. Social History   Tobacco Use   Smoking status: Never   Smokeless tobacco: Never  Substance Use Topics   Alcohol use: Yes    Alcohol/week: 0.0 standard drinks    Comment: Occasionally (light)   Drug use: No    Family History  Problem Relation Age of Onset   Stroke Father 53       x 2   Diabetes type II Father    Hypertension Mother    Colonic polyp Mother    Stroke Paternal Grandmother 59   Diabetes Paternal Grandfather    Breast cancer Maternal Grandmother    Colon cancer Maternal Aunt    Colon cancer Maternal Uncle    Allergies  Allergen Reactions   Monistat [Miconazole]    Other     Other reaction(s): Unknown   Tramadol     Nausea and vomiting   Current Outpatient Medications on File Prior to Visit  Medication Sig Dispense Refill   ibuprofen (ADVIL,MOTRIN) 200 MG tablet Take 200 mg by mouth as needed.     Multiple Vitamin (MULTIVITAMIN) tablet Take 1 tablet by mouth daily.     pantoprazole (PROTONIX) 40 MG tablet TAKE 1 TABLET BY MOUTH  DAILY 90 tablet 0   triamcinolone cream (KENALOG) 0.1 % Apply 1 application topically 2 (two) times daily.     No current facility-administered medications on file prior to visit.   Review of Systems  Constitutional:  Negative for chills, fever and malaise/fatigue.       Rash and itching   HENT:  Negative for congestion, ear pain, sinus pain and sore throat.   Eyes:  Negative for blurred vision, discharge and redness.  Respiratory:  Negative for cough,  shortness of breath and stridor.   Cardiovascular:  Negative for chest pain, palpitations and leg swelling.  Gastrointestinal:  Negative for abdominal pain, diarrhea, nausea and vomiting.  Musculoskeletal:  Negative for myalgias.  Skin:  Negative for rash.  Neurological:  Negative for dizziness and headaches.   Observations/Objective: Patient appears well, in no distress Weight is baseline  No facial swelling or asymmetry Normal voice-not hoarse and no slurred speech No obvious tremor or mobility impairment Moving neck and UEs normally Able to hear the call well  No cough or shortness of breath during interview  Talkative and mentally sharp with no cognitive changes Rash present on L  face/eyelid and lower arms, patches of vesicles with erythema, some dried and no pustules  Affect is normal    Assessment and Plan: Problem List Items Addressed This Visit       Musculoskeletal and Integument   Plant dermatitis    Poison ivy exposure and rash began 3 days earlier  Mainly on arms/hands and L face  No signs of superinfection  Disc imp of keeping clean (soap and water) and dry  Triamcinolone cream prn  Px prednisone taper 30 mg  Disc poss side effect (has tolerated well in the past)  Update if not starting to improve in a week or if worsening  Plans to wash anything that contacted the plant       RESOLVED: Plant allergic contact dermatitis - Primary     Follow Up Instructions: Keep cool Keep rash clean and dry  Take prednisone as directed  Alert [place] if any intolerable side effects  Update if not starting to improve in a week or if worsening      I discussed the assessment and treatment plan with the patient. The patient was provided an opportunity to ask questions and all were answered. The patient agreed with the plan and demonstrated an understanding of the instructions.   The patient was advised to call back or seek an in-person evaluation if the symptoms worsen or if the condition fails to improve as anticipated.     Roxy Manns, MD

## 2020-10-07 ENCOUNTER — Telehealth: Payer: Self-pay | Admitting: Family Medicine

## 2020-10-07 ENCOUNTER — Ambulatory Visit
Admission: EM | Admit: 2020-10-07 | Discharge: 2020-10-07 | Disposition: A | Discharge status: Home / Self Care | Payer: 59 | Attending: Physician Assistant | Admitting: Physician Assistant

## 2020-10-07 ENCOUNTER — Encounter: Payer: Self-pay | Admitting: Emergency Medicine

## 2020-10-07 ENCOUNTER — Other Ambulatory Visit: Payer: Self-pay

## 2020-10-07 DIAGNOSIS — G44209 Tension-type headache, unspecified, not intractable: Secondary | ICD-10-CM | POA: Diagnosis not present

## 2020-10-07 DIAGNOSIS — H539 Unspecified visual disturbance: Secondary | ICD-10-CM

## 2020-10-07 NOTE — Telephone Encounter (Signed)
PLEASE NOTE: All timestamps contained within this report are represented as [place] Standard Time. CONFIDENTIALTY NOTICE: This fax transmission is intended only for the addressee. It contains information that is legally privileged, confidential or otherwise protected from use or disclosure. If you are not the intended recipient, you are strictly prohibited from reviewing, disclosing, copying using or disseminating any of this information or taking any action in reliance on or regarding this information. If you have received this fax in error, please notify us immediately by telephone so that we can arrange for its return to [place]. Phone: [phone], Toll-Free: [phone], Fax: [phone] Page: 1 of 2 Call Id: [id number] [facility] Day - Client TELEPHONE ADVICE RECORD [facility] Patient Name: Joann Davis LE Gender: Female DOB: 1970/01/29 Age: 50 Y 9 M 26 D Return Phone Number: [phone] (Primary) Address: City/ State/ Zip: [place]  [address] Client [facility] Day - Client Client Site [facility] - Day Physician Tower, Idamae Schuller - MD Contact Type Call Who Is Calling Patient / Member / Family / Caregiver Call Type Triage / Clinical Relationship To Patient Self Return Phone Number [phone] (Primary) Chief Complaint Vision - (non urgent symptoms) Reason for Call Symptomatic / Request for Health Information Initial Comment Having issues with her near vision, hard to focus, can only see close up if she blinks a lot. Translation No Nurse Assessment Nurse: Clarita Leber, RN, Deborah Date/Time (Eastern Time): 10/07/2020 11:24:38 AM Confirm and document reason for call. If symptomatic, describe symptoms. ---The caller states that she noticed that this morning her vision is blurry when she tries to read. Her far vision is not affected. No new medication. Taking more ibuprofen as she was injury. Eyes are  not working together very well. Does the patient have any new or worsening symptoms? ---Yes Will a triage be completed? ---Yes Related visit to physician within the last 2 weeks? ---No Does the PT have any chronic conditions? (i.e. diabetes, asthma, this includes High risk factors for pregnancy, etc.) ---Yes List chronic conditions. ---reflux taking protonix Is the patient pregnant or possibly pregnant? (Ask all females between the ages of 71-55) ---No Is this a behavioral health or substance abuse call? ---No Guidelines Guideline Title Affirmed Question Affirmed Notes Nurse Date/Time (Eastern Time) Vision Loss or Change [1] Blurred vision or visual changes AND [2] present now AND [3] sudden onset or new (e.g., minutes, hours, days) (Exception: Womble, RN, Gavin Pound 10/07/2020 11:28:13 AM PLEASE NOTE: All timestamps contained within this report are represented as [place] Standard Time. CONFIDENTIALTY NOTICE: This fax transmission is intended only for the addressee. It contains information that is legally privileged, confidential or otherwise protected from use or disclosure. If you are not the intended recipient, you are strictly prohibited from reviewing, disclosing, copying using or disseminating any of this information or taking any action in reliance on or regarding this information. If you have received this fax in error, please notify us immediately by telephone so that we can arrange for its return to [place]. Phone: [phone], Toll-Free: [phone], Fax: [phone] Page: 2 of 2 Call Id: [id number] Guidelines Guideline Title Affirmed Question Affirmed Notes Nurse Date/Time Lamount Cohen Time) seeing floaters / black specks OR previously diagnosed migraine headaches with same symptoms) Disp. Time Lamount Cohen Time) Disposition Final User 10/07/2020 11:34:03 AM Go to ED Now (or PCP triage) Yes Clarita Leber, RN, Jetty Duhamel Disagree/Comply Comply Caller Understands  Yes PreDisposition Go to ED Care Advice Given Per Guideline GO TO ED NOW (OR  PCP TRIAGE): Comments User: Alita Chyle, RN Date/Time Lamount Cohen Time): 10/07/2020 11:37:19 AM The caller was instructed to go to ED Now. States that she has a sick child at home and this nurse asked her if she could call family or friends to take care of the child so she can be evaluated. She asked about being seen in [place] and this nurse stated that she could try that but there is urgency to her symptoms and she verbalized understanding. She states that she works for [employer] and she knows what the wait time is and this nurse explained that with sudden vision changes and family hx of stroke is that she will be seen quickly to rule that out and she verbalized that she would do as instructed. Referrals GO TO FACILITY UNDECIDED

## 2020-10-07 NOTE — ED Triage Notes (Addendum)
Patient woke this morning feeling fine.  Patient was spending time reading this morning.  Patient started having issues with reading around 30 minutes into reading.  This was at 9 am.  Has a mild headache.  Reports vision changes consisted of print in book was difficult to focus on , print was lying on top of each other.  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    2 weeks ago had a syncopal episode after working and donating blood.  Denies head injury

## 2020-10-07 NOTE — ED Notes (Signed)
Notified erin, pa of patient and complaint

## 2020-10-07 NOTE — Telephone Encounter (Signed)
Agree with that advisement I will watch for correspondence from [facility] Please check on her tomorrow

## 2020-10-07 NOTE — Telephone Encounter (Signed)
Pt was transferred from Access Nurse. Pt said they told her that she needed to be seen today by her Doctor or go to the ED. Pt said that she is having vision change and they wanted her to go to the ED. She just wanted to discuss with our office and schedule an appt at [facility]

## 2020-10-07 NOTE — ED Provider Notes (Signed)
Joann Davis    CSN: [id number] Arrival date & time: 10/07/20  1247      History   Chief Complaint Chief Complaint  Patient presents with   Headache    HPI Joann Davis is a 50 y.o. female.   Patient presents today with a several hour history of vision changes that occurred earlier today and have since resolved.  She reports that when she woke up this morning and she was trying to read a book she felt as though her vision was abnormal; denies frank diplopia but states overlapping of visual images.  She denies any visual field loss, ocular pain, nausea, vomiting.  She does have a history of ocular migraines but states those are typically different as she feels as though something is in her vision or she has bright lights.  She denies any recent glasses changes and reports that she is due for follow-up with her optometrist (my eye doctor) as her glasses are currently about a-year-old.  She denies any history of neurological condition including myasthenia gravis.  She denies any medication changes, recent illness, recent head injury.  She does report approximately 2 weeks ago she had near syncopal episode after being dehydrated and giving blood that resulted in her falling forward but denies any head injury or loss of consciousness with this episode.  She denies any recurrent episodes or ongoing symptoms since that time.  Currently, she does have a mild headache which he rates 1 0-10 pain scale, localized to frontal region, described as aching, no aggravating leaving factors identified.  She has taken ibuprofen with improvement of symptoms.  Current symptoms are not similar to previous migraine episodes.   Past Medical History:  Diagnosis Date   Elevated blood pressure reading without diagnosis of hypertension    GERD (gastroesophageal reflux disease)    Migraine with aura, without mention of intractable migraine without mention of status migrainosus    Pre-eclampsia     Temporomandibular joint disorders, unspecified     Patient Active Problem List   Diagnosis Date Noted   Plant dermatitis 08/27/2020   Hyperlipidemia 08/14/2019   Obesity (BMI 30-39.9) 06/28/2017   Routine general medical examination at a health care facility 11/20/2011   MIGRAINE WITH AURA 02/11/2009   GERD 10/24/2006    History reviewed. No pertinent surgical history.  OB History   No obstetric history on file.      Home Medications    Prior to Admission medications   Medication Sig Start Date End Date Taking? Authorizing Provider  ibuprofen (ADVIL,MOTRIN) 200 MG tablet Take 200 mg by mouth as needed.    [provider]  Multiple Vitamin (MULTIVITAMIN) tablet Take 1 tablet by mouth daily.    [provider]  pantoprazole (PROTONIX) 40 MG tablet TAKE 1 TABLET BY MOUTH  DAILY 06/10/20   Tower, Audrie Gallus, MD  predniSONE (DELTASONE) 10 MG tablet Take 3 pills once daily by mouth for 3 days, then 2 pills once daily for 3 days, then 1 pill once daily for 3 days and then stop Patient not taking: Reported on 10/07/2020 08/27/20   Tower, Audrie Gallus, MD  triamcinolone cream (KENALOG) 0.1 % Apply 1 application topically 2 (two) times daily.    [provider]    Family History Family History  Problem Relation Age of Onset   Hypertension Mother    Colonic polyp Mother    Stroke Father 75       x 2   Diabetes type  II Father    Breast cancer Maternal Grandmother    Stroke Paternal Grandmother 25   Diabetes Paternal Grandfather    Colon cancer Maternal Aunt    Colon cancer Maternal Uncle     Social History Social History   Tobacco Use   Smoking status: Never   Smokeless tobacco: Never  Vaping Use   Vaping Use: Never used  Substance Use Topics   Alcohol use: Yes    Alcohol/week: 0.0 standard drinks    Comment: Occasionally (light)   Drug use: No     Allergies   Monistat [miconazole], Other, and Tramadol   Review of Systems Review of Systems   Constitutional:  Positive for activity change. Negative for appetite change, fatigue and fever.  Eyes:  Positive for visual disturbance. Negative for photophobia, pain, discharge and redness.  Respiratory:  Negative for cough and shortness of breath.   Cardiovascular:  Negative for chest pain.  Gastrointestinal:  Negative for abdominal pain, diarrhea, nausea and vomiting.  Neurological:  Positive for headaches. Negative for dizziness, tremors, syncope, facial asymmetry, speech difficulty, weakness, light-headedness and numbness.    Physical Exam Triage Vital Signs ED Triage Vitals  Enc Vitals Group     BP 10/07/20 1302 139/84     Pulse Rate 10/07/20 1302 87     Resp 10/07/20 1302 18     Temp 10/07/20 1302 98.5 F (36.9 C)     Temp Source 10/07/20 1302 Oral     SpO2 10/07/20 1302 97 %     Weight --      Height --      Head Circumference --      Peak Flow --      Pain Score 10/07/20 1315 1     Pain Loc --      Pain Edu? --      Excl. in GC? --    No data found.  Updated Vital Signs BP 139/84 (BP Location: Left Arm)   Pulse 87   Temp 98.5 F (36.9 C) (Oral)   Resp 18   LMP 09/16/2020   SpO2 97%   Visual Acuity Right Eye Distance: 20/20 (wears glasses) Left Eye Distance: 20/20 Bilateral Distance: 20/16  Right Eye Near:   Left Eye Near:    Bilateral Near:     Physical Exam Vitals reviewed.  Constitutional:      General: She is awake. She is not in acute distress.    Appearance: Normal appearance. She is well-developed. She is not ill-appearing.     Comments: Very pleasant female appears stated age in no acute distress sitting comfortably in exam room  HENT:     Head: Normocephalic and atraumatic. No raccoon eyes, Battle's sign or contusion.     Right Ear: Tympanic membrane, ear canal and external ear normal. No hemotympanum.     Left Ear: Tympanic membrane, ear canal and external ear normal. No hemotympanum.     Nose: Nose normal.     Mouth/Throat:     Tongue:  Tongue does not deviate from midline.     Pharynx: Uvula midline. No oropharyngeal exudate or posterior oropharyngeal erythema.  Eyes:     Extraocular Movements: Extraocular movements intact.     Conjunctiva/sclera: Conjunctivae normal.     Pupils: Pupils are equal, round, and reactive to light.  Cardiovascular:     Rate and Rhythm: Normal rate and regular rhythm.     Heart sounds: Normal heart sounds, S1 normal and S2 normal. No murmur heard.  Pulmonary:     Effort: Pulmonary effort is normal.     Breath sounds: Normal breath sounds. No wheezing, rhonchi or rales.     Comments: Clear to auscultation bilaterally Abdominal:     Palpations: Abdomen is soft.     Tenderness: There is no abdominal tenderness.  Musculoskeletal:     Cervical back: Normal range of motion and neck supple. No spinous process tenderness or muscular tenderness.     Comments: Strength 5/5 bilateral upper and lower extremities  Lymphadenopathy:     Head:     Right side of head: No submental, submandibular or tonsillar adenopathy.     Left side of head: No submental, submandibular or tonsillar adenopathy.  Neurological:     General: No focal deficit present.     Cranial Nerves: Cranial nerves are intact.     Motor: Motor function is intact.     Coordination: Coordination is intact.     Gait: Gait is intact.     Comments: Cranial nerves II through XII intact.  Psychiatric:        Behavior: Behavior is cooperative.     UC Treatments / Results  Labs (all labs ordered are listed, but only abnormal results are displayed) Labs Reviewed - No data to display  EKG   Radiology No results found.  Procedures Procedures (including critical care time)  Medications Ordered in UC Medications - No data to display  Initial Impression / Assessment and Plan / UC Course  I have reviewed the triage vital signs and the nursing notes.  Pertinent labs & imaging results that were available during my care of the patient  were reviewed by me and considered in my medical decision making (see chart for details).      Vital signs and physical exam reassuring today; no indication for emergent evaluation or imaging.  Patient reports symptoms have essentially resolved and she denies any concerning symptoms.  Discussed that there are several things that can cause visual disturbance and is difficult to determine the etiology since she is not actively having symptoms.  Recommended that she follow-up with her optometrist but also discussed that if she has recurrent episodes she should follow-up with her PCP to consider referral to specialist.  Encourage patient to rest and drink plenty of fluid.  We did discuss that she should be evaluated for near syncopal episode but given this occurred over 2 weeks ago and she has had no ongoing symptoms no indication for emergent evaluation/CT based on [place] head CT rules.  We discussed at length that if she has recurrent visual changes particularly partial visual field loss, severe headache, weakness, dysarthria she must go to the emergency room.  Recommend she follow-up with her primary care provider within a week.  Strict return precautions given to which she expressed understanding.  Final Clinical Impressions(s) / UC Diagnoses   Final diagnoses:  Tension headache  Vision changes     Discharge Instructions      As we discussed, if you have any worsening symptoms including recurrent headache, vision changes, nausea, vomiting, weakness, difficulty speaking you need to go to the emergency room.  Please make sure you are drinking plenty of fluid.  You can use over-the-counter medications for symptom relief.  I recommend that you follow-up with your optometrist as you may need new glasses.  Clinic follow-up with your primary care provider within a week evaluation.  Any point you have any worsening symptoms as we discussed you should go to the  emergency room.     ED Prescriptions    None    PDMP not reviewed this encounter.   Jeani Hawking, PA-C 10/07/20 1400

## 2020-10-07 NOTE — Telephone Encounter (Signed)
Spoke to patient by telephone and was advised that she does not feel that she needs to go to the ER. Patient stated that her visual problem started this morning. Patient stated that she does have a mild headache. Patient stated that she does have a history of migraines. Patient denies any numbness, tingling, slurred speech or any other symptoms. Patient was advised that she should have a face to face evaluation. Patient was given information on the [facility]. Patient stated that she will plan on going to the UC after she walks her dog.

## 2020-10-07 NOTE — Discharge Instructions (Addendum)
As we discussed, if you have any worsening symptoms including recurrent headache, vision changes, nausea, vomiting, weakness, difficulty speaking you need to go to the emergency room.  Please make sure you are drinking plenty of fluid.  You can use over-the-counter medications for symptom relief.  I recommend that you follow-up with your optometrist as you may need new glasses.  Clinic follow-up with your primary care provider within a week evaluation.  Any point you have any worsening symptoms as we discussed you should go to the emergency room.

## 2020-10-09 NOTE — Telephone Encounter (Signed)
Left VM requesting pt to call the office back   (Pt did go to UC)

## 2020-12-01 ENCOUNTER — Telehealth: Payer: Self-pay | Admitting: Family Medicine

## 2020-12-02 NOTE — Telephone Encounter (Signed)
Protonix refilled once. Pt is overdue for her CPE or at least a med refill, please schedule

## 2020-12-02 NOTE — Telephone Encounter (Signed)
Called patient and left vm to call back to schedule

## 2020-12-07 NOTE — Telephone Encounter (Signed)
CALLED PATIENT AND GOT HER SCHEDULED FOR 12/7 @3 

## 2020-12-16 ENCOUNTER — Encounter: Payer: 59 | Admitting: Family Medicine

## 2020-12-17 ENCOUNTER — Encounter: Payer: Self-pay | Admitting: Family Medicine

## 2020-12-17 ENCOUNTER — Ambulatory Visit (INDEPENDENT_AMBULATORY_CARE_PROVIDER_SITE_OTHER): Payer: 59 | Admitting: Family Medicine

## 2020-12-17 ENCOUNTER — Other Ambulatory Visit: Payer: Self-pay

## 2020-12-17 VITALS — BP 124/78 | HR 65 | Temp 98.0°F | Ht 67.0 in | Wt 186.1 lb

## 2020-12-17 DIAGNOSIS — Z23 Encounter for immunization: Secondary | ICD-10-CM

## 2020-12-17 DIAGNOSIS — K219 Gastro-esophageal reflux disease without esophagitis: Secondary | ICD-10-CM | POA: Diagnosis not present

## 2020-12-17 DIAGNOSIS — Z79899 Other long term (current) drug therapy: Secondary | ICD-10-CM

## 2020-12-17 DIAGNOSIS — E7800 Pure hypercholesterolemia, unspecified: Secondary | ICD-10-CM

## 2020-12-17 DIAGNOSIS — Z0000 Encounter for general adult medical examination without abnormal findings: Secondary | ICD-10-CM

## 2020-12-17 LAB — COMPREHENSIVE METABOLIC PANEL
ALT: 17 U/L (ref 0–35)
AST: 17 U/L (ref 0–37)
Albumin: 4 g/dL (ref 3.5–5.2)
Alkaline Phosphatase: 60 U/L (ref 39–117)
BUN: 11 mg/dL (ref 6–23)
CO2: 30 mEq/L (ref 19–32)
Calcium: 9.2 mg/dL (ref 8.4–10.5)
Chloride: 102 mEq/L (ref 96–112)
Creatinine, Ser: 0.55 mg/dL (ref 0.40–1.20)
GFR: 106.43 mL/min (ref >60.00)
Glucose, Bld: 91 mg/dL (ref 70–99)
Potassium: 4.1 mEq/L (ref 3.5–5.1)
Sodium: 137 mEq/L (ref 135–145)
Total Bilirubin: 0.6 mg/dL (ref 0.2–1.2)
Total Protein: 6.8 g/dL (ref 6.0–8.3)

## 2020-12-17 LAB — CBC WITH DIFFERENTIAL/PLATELET
Basophils Absolute: 0 10*3/uL (ref 0.0–0.1)
Basophils Relative: 0.8 % (ref 0.0–3.0)
Eosinophils Absolute: 0.1 10*3/uL (ref 0.0–0.7)
Eosinophils Relative: 1.2 % (ref 0.0–5.0)
HCT: 35.3 % — ABNORMAL LOW (ref 36.0–46.0)
Hemoglobin: 11.3 g/dL — ABNORMAL LOW (ref 12.0–15.0)
Lymphocytes Relative: 30 % (ref 12.0–46.0)
Lymphs Abs: 1.4 10*3/uL (ref 0.7–4.0)
MCHC: 32.2 g/dL (ref 30.0–36.0)
MCV: 77 fl — ABNORMAL LOW (ref 78.0–100.0)
Monocytes Absolute: 0.5 10*3/uL (ref 0.1–1.0)
Monocytes Relative: 10.7 % (ref 3.0–12.0)
Neutro Abs: 2.7 10*3/uL (ref 1.4–7.7)
Neutrophils Relative %: 57.3 % (ref 43.0–77.0)
Platelets: 520 10*3/uL — ABNORMAL HIGH (ref 150.0–400.0)
RBC: 4.58 Mil/uL (ref 3.87–5.11)
RDW: 15 % (ref 11.5–15.5)
WBC: 4.8 10*3/uL (ref 4.0–10.5)

## 2020-12-17 LAB — LIPID PANEL
Cholesterol: 191 mg/dL (ref 0–200)
HDL: 44.5 mg/dL (ref >39.00)
LDL Cholesterol: 128 mg/dL — ABNORMAL HIGH (ref 0–99)
NonHDL: 146.86
Total CHOL/HDL Ratio: 4
Triglycerides: 96 mg/dL (ref 0.0–149.0)
VLDL: 19.2 mg/dL (ref 0.0–40.0)

## 2020-12-17 LAB — TSH: TSH: 0.73 u[IU]/mL (ref 0.35–5.50)

## 2020-12-17 LAB — VITAMIN B12: Vitamin B-12: 469 pg/mL (ref 211–911)

## 2020-12-17 MED ORDER — PANTOPRAZOLE SODIUM 40 MG PO TBEC: 40.0000 mg | DELAYED_RELEASE_TABLET | Freq: Every day | ORAL | 3 refills | Status: DC

## 2020-12-17 NOTE — Assessment & Plan Note (Signed)
Disc goals for lipids and reasons to control them Rev last labs with pt Rev low sat fat diet in detail Diet is fair  Lipid panel ordered fasting

## 2020-12-17 NOTE — Assessment & Plan Note (Signed)
Reviewed health habits including diet and exercise and skin cancer prevention Reviewed appropriate screening tests for age  Also reviewed health mt list, fam hx and immunization status , as well as social and family history    Labs ordered See HPI Flu shot given today  Interested in shingrix and plans to check on coverage Pap utd  Gyn visit utd with last mammogram feb 2022, enc she does self breast exams  Colonoscopy utd Encouraged more exercise and low trans fat diet

## 2020-12-17 NOTE — Patient Instructions (Addendum)
If you are interested in the shingles vaccine series (Shingrix), call your insurance or pharmacy to check on coverage and location it must be given.  If affordable - you can schedule it here or at your pharmacy depending on coverage   Flu shot today  Labs today   Avoid red meat/ fried foods/ egg yolks/ fatty breakfast meats/ butter, cheese and high fat dairy/ and shellfish    Think about adding more exercise

## 2020-12-17 NOTE — Progress Notes (Signed)
Subjective:    Patient ID: Joann Davis, female    DOB: May 24, 1970, 50 y.o.   MRN: [id number]  This visit occurred during the SARS-CoV-2 public health emergency.  Safety protocols were in place, including screening questions prior to the visit, additional usage of staff PPE, and extensive cleaning of exam room while observing appropriate contact time as indicated for disinfecting solutions.   HPI Here for health maintenance exam and to review chronic medical problems    Wt Readings from Last 3 Encounters:  12/17/20 186 lb 2 oz (84.4 kg)  03/24/20 190 lb (86.2 kg)  08/14/19 193 lb 8 oz (87.8 kg)   29.15 kg/m Lost a [place]o weight  Runs after the dog for exercise    Doing ok  Had a stressful month  Kids got covid  Husb's gm died  Pipe burst in her house  Has to work holidays   Flu shot-today Zoster status- interested in shingrix  Covid immunized Tdap 05/2020  Pap 01/2016 neg at gyn with neg HPV screen Sees gyn every year  Mammogram 02/24/20 [place]yn ([place]) MGM had breast cancer Self breast exam- no breast lumps  Has had cyst in the past   Colonoscopy 07/2019  Aunt and uncle had colon cancer   BP Readings from Last 3 Encounters:  12/17/20 124/78  10/07/20 139/84  08/14/19 122/68   Pulse Readings from Last 3 Encounters:  12/17/20 65  10/07/20 87  08/14/19 71    Hyperlipidemia Lab Results  Component Value Date   CHOL 192 08/06/2019   HDL 36.80 (L) 08/06/2019   LDLCALC 128 (H) 08/06/2019   TRIG 136.0 08/06/2019   CHOLHDL 5 08/06/2019   Due for labs   She takes protonix for GERD  Patient Active Problem List   Diagnosis Date Noted   Current use of proton pump inhibitor 12/17/2020   Hyperlipidemia 08/14/2019   Routine general medical examination at a health care facility 11/20/2011   MIGRAINE WITH AURA 02/11/2009   GERD 10/24/2006   Past Medical History:  Diagnosis Date   Elevated blood pressure reading without diagnosis of hypertension     GERD (gastroesophageal reflux disease)    Migraine with aura, without mention of intractable migraine without mention of status migrainosus    Pre-eclampsia    Temporomandibular joint disorders, unspecified    History reviewed. No pertinent surgical history. Social History   Tobacco Use   Smoking status: Never   Smokeless tobacco: Never  Vaping Use   Vaping Use: Never used  Substance Use Topics   Alcohol use: Yes    Alcohol/week: 0.0 standard drinks    Comment: Occasionally (light)   Drug use: No   Family History  Problem Relation Age of Onset   Hypertension Mother    Colonic polyp Mother    Stroke Father 62       x 2   Diabetes type II Father    Breast cancer Maternal Grandmother    Stroke Paternal Grandmother 20   Diabetes Paternal Grandfather    Colon cancer Maternal Aunt    Colon cancer Maternal Uncle    Allergies  Allergen Reactions   Monistat [Miconazole]    Other     Other reaction(s): Unknown   Tramadol     Nausea and vomiting   Current Outpatient Medications on File Prior to Visit  Medication Sig Dispense Refill   ibuprofen (ADVIL,MOTRIN) 200 MG tablet Take 200 mg by mouth as needed.     Multiple Vitamin (MULTIVITAMIN)  tablet Take 1 tablet by mouth daily.     No current facility-administered medications on file prior to visit.    Review of Systems  Constitutional:  Negative for activity change, appetite change, fatigue, fever and unexpected weight change.  HENT:  Negative for congestion, ear pain, rhinorrhea, sinus pressure and sore throat.   Eyes:  Negative for pain, redness and visual disturbance.  Respiratory:  Negative for cough, shortness of breath and wheezing.   Cardiovascular:  Negative for chest pain and palpitations.  Gastrointestinal:  Negative for abdominal pain, blood in stool, constipation and diarrhea.  Endocrine: Negative for polydipsia and polyuria.  Genitourinary:  Negative for dysuria, frequency and urgency.  Musculoskeletal:   Negative for arthralgias, back pain and myalgias.  Skin:  Negative for pallor and rash.  Allergic/Immunologic: Negative for environmental allergies.  Neurological:  Negative for dizziness, syncope and headaches.  Hematological:  Negative for adenopathy. Does not bruise/bleed easily.  Psychiatric/Behavioral:  Negative for decreased concentration and dysphoric mood. The patient is not nervous/anxious.       Objective:   Physical Exam Constitutional:      General: She is not in acute distress.    Appearance: Normal appearance. She is well-developed. She is not ill-appearing or diaphoretic.     Comments: Overweight   HENT:     Head: Normocephalic and atraumatic.     Right Ear: Tympanic membrane, ear canal and external ear normal.     Left Ear: Tympanic membrane, ear canal and external ear normal.     Nose: Nose normal. No congestion.     Mouth/Throat:     Mouth: Mucous membranes are moist.     Pharynx: Oropharynx is clear. No posterior oropharyngeal erythema.  Eyes:     General: No scleral icterus.    Extraocular Movements: Extraocular movements intact.     Conjunctiva/sclera: Conjunctivae normal.     Pupils: Pupils are equal, round, and reactive to light.  Neck:     Thyroid: No thyromegaly.     Vascular: No carotid bruit or JVD.  Cardiovascular:     Rate and Rhythm: Normal rate and regular rhythm.     Pulses: Normal pulses.     Heart sounds: Normal heart sounds.    No gallop.  Pulmonary:     Effort: Pulmonary effort is normal. No respiratory distress.     Breath sounds: Normal breath sounds. No wheezing.     Comments: Good air exch Chest:     Chest wall: No tenderness.  Abdominal:     General: Bowel sounds are normal. There is no distension or abdominal bruit.     Palpations: Abdomen is soft. There is no mass.     Tenderness: There is no abdominal tenderness.     Hernia: No hernia is present.  Genitourinary:    Comments: Breast and pelvic exams are done by gyn    Musculoskeletal:        General: No tenderness. Normal range of motion.     Cervical back: Normal range of motion and neck supple. No rigidity. No muscular tenderness.     Right lower leg: No edema.     Left lower leg: No edema.  Lymphadenopathy:     Cervical: No cervical adenopathy.  Skin:    General: Skin is warm and dry.     Coloration: Skin is not pale.     Findings: No erythema or rash.     Comments: Solar lentigines diffusely Few raised smooth nevi on arms  Neurological:  Mental Status: She is alert. Mental status is at baseline.     Cranial Nerves: No cranial nerve deficit.     Motor: No abnormal muscle tone.     Coordination: Coordination normal.     Gait: Gait normal.     Deep Tendon Reflexes: Reflexes are normal and symmetric. Reflexes normal.  Psychiatric:        Mood and Affect: Mood normal.        Cognition and Memory: Cognition and memory normal.          Assessment & Plan:   Problem List Items Addressed This Visit       Digestive   GERD    Symptoms are controlled as long as she stays on protonix 40 mg daily   B12 added to her labs today due to poss of decreased absorption       Relevant Medications   pantoprazole (PROTONIX) 40 MG tablet     Other   Routine general medical examination at a health care facility - Primary    Reviewed health habits including diet and exercise and skin cancer prevention Reviewed appropriate screening tests for age  Also reviewed health mt list, fam hx and immunization status , as well as social and family history    Labs ordered See HPI Flu shot given today  Interested in shingrix and plans to check on coverage Pap utd  Gyn visit utd with last mammogram feb 2022, enc she does self breast exams  Colonoscopy utd Encouraged more exercise and low trans fat diet       Relevant Orders   CBC with Differential/Platelet   Comprehensive metabolic panel   Lipid panel   TSH   Flu Vaccine QUAD 6+ mos PF IM (Fluarix  Quad PF) (Completed)   Hyperlipidemia    Disc goals for lipids and reasons to control them Rev last labs with pt Rev low sat fat diet in detail Diet is fair  Lipid panel ordered fasting       Relevant Orders   Lipid panel   Current use of proton pump inhibitor    B12 level added to labs Fairly balanced diet       Relevant Orders   Vitamin B12   Other Visit Diagnoses     Need for influenza vaccination       Relevant Orders   Flu Vaccine QUAD 6+ mos PF IM (Fluarix Quad PF) (Completed)

## 2020-12-17 NOTE — Assessment & Plan Note (Signed)
B12 level added to labs Fairly balanced diet

## 2020-12-17 NOTE — Assessment & Plan Note (Signed)
Symptoms are controlled as long as she stays on protonix 40 mg daily   B12 added to her labs today due to poss of decreased absorption

## 2021-03-29 ENCOUNTER — Other Ambulatory Visit: Payer: Self-pay

## 2021-03-29 ENCOUNTER — Telehealth (INDEPENDENT_AMBULATORY_CARE_PROVIDER_SITE_OTHER): Payer: 59 | Admitting: Family

## 2021-03-29 ENCOUNTER — Encounter: Payer: Self-pay | Admitting: Family

## 2021-03-29 VITALS — Ht 67.0 in | Wt 185.0 lb

## 2021-03-29 DIAGNOSIS — J020 Streptococcal pharyngitis: Secondary | ICD-10-CM | POA: Diagnosis not present

## 2021-03-29 DIAGNOSIS — J029 Acute pharyngitis, unspecified: Secondary | ICD-10-CM | POA: Diagnosis not present

## 2021-03-29 MED ORDER — AMOXICILLIN-POT CLAVULANATE 875-125 MG PO TABS: 1.0000 | ORAL_TABLET | Freq: Two times a day (BID) | ORAL | 0 refills | Status: AC

## 2021-03-29 NOTE — Assessment & Plan Note (Signed)
Warm salt water gargles.

## 2021-03-29 NOTE — Progress Notes (Signed)
? ? ?MyChart Video Visit ? ? ? ?Virtual Visit via Video Note  ? ?This visit type was conducted due to national recommendations for restrictions regarding the COVID-19 Pandemic (e.g. social distancing) in an effort to limit this patient's exposure and mitigate transmission in our community. This patient is at least at moderate risk for complications without adequate follow up. This format is felt to be most appropriate for this patient at this time. Physical exam was limited by quality of the video and audio technology used for the visit. CMA was able to get the patient set up on a video visit. ? ?Patient location: Home. Patient and provider in visit ?Provider location: Office ? ?I discussed the limitations of evaluation and management by telemedicine and the availability of in person appointments. The patient expressed understanding and agreed to proceed. ? ?Visit Date: 03/29/2021 ? ?Today's healthcare provider: Mort Sawyersabitha Kris No, FNP  ? ? ? ?Subjective:  ? ? Patient ID: Joann Davis, female    DOB: 04/28/1970, 51 y.o.   MRN: [id number] ? ?Chief Complaint  ?Patient presents with  ? Sore Throat  ?   Sore throat white things in back of throat, redness--yesterday  ? ? ?Sore Throat  ?Associated symptoms include ear pain (rght ear pain) and trouble swallowing (pain with swallowing). Pertinent negatives include no congestion, coughing or shortness of breath.  ? ?Pt here via video visit.  ?Yesterday started with sore throat, and white on the back of her tonsils. She is having pain with swallowing and she does feel some right ear tenderness. Son, who is 11, tested positive for strep throat.  ? ?No fever, no cough.  ? ?Past Medical History:  ?Diagnosis Date  ? Elevated blood pressure reading without diagnosis of hypertension   ? GERD (gastroesophageal reflux disease)   ? Migraine with aura, without mention of intractable migraine without mention of status migrainosus   ? Pre-eclampsia   ? Temporomandibular joint disorders,  unspecified   ? ? ?No past surgical history on file. ? ?Family History  ?Problem Relation Age of Onset  ? Hypertension Mother   ? Colonic polyp Mother   ? Stroke Father [phone]  ?     x 2  ? Diabetes type II Father   ? Breast cancer Maternal Grandmother   ? Stroke Paternal Grandmother [phone]  ? Diabetes Paternal Grandfather   ? Colon cancer Maternal Aunt   ? Colon cancer Maternal Uncle   ? ? ?Social History  ? ?Socioeconomic History  ? Marital status: Married  ?  Spouse name: Not on file  ? Number of children: 3  ? Years of education: Not on file  ? Highest education level: Not on file  ?Occupational History  ? Occupation: [employer] Tele[employer]  ?  Employer: UNEMPLOYED  ?Tobacco Use  ? Smoking status: Never  ? Smokeless tobacco: Never  ?Vaping Use  ? Vaping Use: Never used  ?Substance and Sexual Activity  ? Alcohol use: Yes  ?  Alcohol/week: 0.0 standard drinks  ?  Comment: Occasionally (light)  ? Drug use: No  ? Sexual activity: Not on file  ?Other Topics Concern  ? Not on file  ?Social History Narrative  ? Married; 3 children (2 girls, 1 boy)  ?   ? Works for city [employer], night shift  ?   ? English degree [employer]); EMT course  ? [place]ickens   ? ?Social Determinants of Health  ? ?Financial Resource Strain: Not on file  ?Food  Insecurity: Not on file  ?Transportation Needs: Not on file  ?Physical Activity: Not on file  ?Stress: Not on file  ?Social Connections: Not on file  ?Intimate Partner Violence: Not on file  ? ? ?Outpatient Medications Prior to Visit  ?Medication Sig Dispense Refill  ? ibuprofen (ADVIL,MOTRIN) 200 MG tablet Take 200 mg by mouth as needed.    ? Multiple Vitamin (MULTIVITAMIN) tablet Take 1 tablet by mouth daily.    ? pantoprazole (PROTONIX) 40 MG tablet Take 1 tablet (40 mg total) by mouth daily. 90 tablet 3  ? ?No facility-administered medications prior to visit.  ? ? ?Allergies  ?Allergen Reactions  ? Monistat [Miconazole]   ? Other   ?  Other reaction(s): Unknown  ? Tramadol    ?  Nausea and vomiting  ? ? ?Review of Systems  ?Constitutional:  Negative for chills and fever.  ?HENT:  Positive for ear pain (rght ear pain), sore throat and trouble swallowing (pain with swallowing). Negative for congestion and sinus pressure.   ?Respiratory:  Negative for cough, shortness of breath and wheezing.   ?Cardiovascular:  Negative for chest pain and palpitations.  ? ?   ?Objective:  ?  ?Physical Exam ?Vitals reviewed.  ?Constitutional:   ?   General: She is not in acute distress. ?   Appearance: She is well-developed.  ?Pulmonary:  ?   Effort: Pulmonary effort is normal.  ?Neurological:  ?   General: No focal deficit present.  ?   Mental Status: She is alert and oriented to person, place, and time.  ?Psychiatric:     ?   Mood and Affect: Mood normal.     ?   Behavior: Behavior normal.  ? ? ?Ht 5\' 7"  (1.702 m)   Wt 185 lb (83.9 kg)   BMI 28.98 kg/m?  ?Wt Readings from Last 3 Encounters:  ?03/29/21 185 lb (83.9 kg)  ?12/17/20 186 lb 2 oz (84.4 kg)  ?03/24/20 190 lb (86.2 kg)  ? ? ?   ?Assessment & Plan:  ? ?Problem List Items Addressed This Visit   ? ?  ? Respiratory  ? Strep pharyngitis  ?  Strep tested positive in office.  ?rx for augmentin 875/125 mg po bid x 10 days.  ?Ibuprofen/tyelnol prn sore throat/fever ?Pt told to F/u if no improvement in the next 2-3 days. ? ?  ?  ? Relevant Medications  ? amoxicillin-clavulanate (AUGMENTIN) 875-125 MG tablet  ?  ? Other  ? Sore throat - Primary  ?  Warm salt water gargles ? ?  ?  ? ? ?I am having Joann Davis start on amoxicillin-clavulanate. I am also having her maintain her multivitamin, ibuprofen, and pantoprazole. ? ?Meds ordered this encounter  ?Medications  ? amoxicillin-clavulanate (AUGMENTIN) 875-125 MG tablet  ?  Sig: Take 1 tablet by mouth 2 (two) times daily for 10 days.  ?  Dispense:  20 tablet  ?  Refill:  0  ?  Order Specific Question:   Supervising Provider  ?  Answer:   BEDSOLE, AMY E [[id number]]  ? ? ?I discussed the assessment and  treatment plan with the patient. The patient was provided an opportunity to ask questions and all were answered. The patient agreed with the plan and demonstrated an understanding of the instructions. ?  ?The patient was advised to call back or seek an in-person evaluation if the symptoms worsen or if the condition fails to improve as anticipated. ? ?I provided 15 minutes of  face-to-face time during this encounter. ? ? ?Mort Sawyers, FNP ?Nature conservation officer at [facility] ?[phone] (phone) ?[phone] (fax) ? ?[facility]  ?

## 2021-03-29 NOTE — Assessment & Plan Note (Signed)
Strep tested positive in office.  rx for augmentin 875/125 mg po bid x 10 days Ibuprofen/tyelnol prn sore throat/fever Pt told to F/u if no improvement in the next 2-3 days.  

## 2021-04-19 ENCOUNTER — Ambulatory Visit
Admission: RE | Admit: 2021-04-19 | Discharge: 2021-04-19 | Disposition: A | Discharge status: Home / Self Care | Payer: 59 | Source: Ambulatory Visit | Attending: Urgent Care | Admitting: Urgent Care

## 2021-04-19 VITALS — BP 132/84 | HR 74 | Temp 98.1°F | Resp 16

## 2021-04-19 DIAGNOSIS — J209 Acute bronchitis, unspecified: Secondary | ICD-10-CM

## 2021-04-19 MED ORDER — PREDNISONE 50 MG PO TABS: 50.0000 mg | ORAL_TABLET | Freq: Every day | ORAL | 0 refills | Status: AC

## 2021-04-19 MED ORDER — MONTELUKAST SODIUM 10 MG PO TABS: 10.0000 mg | ORAL_TABLET | Freq: Every day | ORAL | 0 refills | Status: DC

## 2021-04-19 MED ORDER — HYDROCOD POLI-CHLORPHE POLI ER 10-8 MG/5ML PO SUER: 5.0000 mL | Freq: Every evening | ORAL | 0 refills | Status: DC | PRN

## 2021-04-19 NOTE — ED Provider Notes (Signed)
?[facility] ? ? ? ?CSN: [id number] ?Arrival date & time: 04/19/21  1441 ? ? ?  ? ?History   ?Chief Complaint ?Chief Complaint  ?Patient presents with  ? Cough  ?  Resp infection with cough for 10 days with no improvement - Entered by patient  ? ? ?HPI ?Joann Davis is a 51 y.o. female.  ? ?Pleasant 51 year old female presents today with a 11 day history of severe worsening cough.  She states it started on 04/09/2021.  She was on antibiotics at that time for strep throat.  Everyone in her family had upper respiratory symptoms at that time as well, her husband is now better but she feels worse.  She denies a fever, but states a harsh cough.  She states the cough is worse in the morning it is bringing up green globs of sticky mucus, and the cough becomes dry during the day.  She has been taking numerous over-the-counter medications including Mucinex, benzonatate, DayQuil, NyQuil, without relief.  She is having trouble sleeping due to the cough.  She has no history of allergies or asthma.  She does not smoke.  She denies shortness of breath, but feels tight in the chest. She admits her strep throat sx have resolved.  ? ? ?Cough ?Associated symptoms: no chest pain, no fever and no shortness of breath   ? ?Past Medical History:  ?Diagnosis Date  ? Elevated blood pressure reading without diagnosis of hypertension   ? GERD (gastroesophageal reflux disease)   ? Migraine with aura, without mention of intractable migraine without mention of status migrainosus   ? Pre-eclampsia   ? Temporomandibular joint disorders, unspecified   ? ? ?Patient Active Problem List  ? Diagnosis Date Noted  ? Strep pharyngitis 03/29/2021  ? Sore throat 03/29/2021  ? Current use of proton pump inhibitor 12/17/2020  ? Hyperlipidemia 08/14/2019  ? Routine general medical examination at a health care facility 11/20/2011  ? MIGRAINE WITH AURA 02/11/2009  ? GERD 10/24/2006  ? ? ?History reviewed. No pertinent surgical history. ? ?OB History    ?No obstetric history on file. ?  ? ? ? ?Home Medications   ? ?Prior to Admission medications   ?Medication Sig Start Date End Date Taking? Authorizing Provider  ?chlorpheniramine-HYDROcodone (TUSSIONEX PENNKINETIC ER) 10-8 MG/5ML Take 5 mLs by mouth at bedtime as needed for cough. 04/19/21  Yes Bobbie Virden L, PA  ?montelukast (SINGULAIR) 10 MG tablet Take 1 tablet (10 mg total) by mouth at bedtime. 04/19/21  Yes Herberth Deharo L, PA  ?predniSONE (DELTASONE) 50 MG tablet Take 1 tablet (50 mg total) by mouth daily with breakfast for 3 days. 04/19/21 04/22/21 Yes Jahseh Lucchese L, PA  ?ibuprofen (ADVIL,MOTRIN) 200 MG tablet Take 200 mg by mouth as needed.    [provider]  ?Multiple Vitamin (MULTIVITAMIN) tablet Take 1 tablet by mouth daily.    [provider]  ?pantoprazole (PROTONIX) 40 MG tablet Take 1 tablet (40 mg total) by mouth daily. 12/17/20   Tower, Audrie Gallus, MD  ? ? ?Family History ?Family History  ?Problem Relation Age of Onset  ? Hypertension Mother   ? Colonic polyp Mother   ? Stroke Father 37  ?     x 2  ? Diabetes type II Father   ? Breast cancer Maternal Grandmother   ? Stroke Paternal Grandmother 40  ? Diabetes Paternal Grandfather   ? Colon cancer Maternal Aunt   ? Colon cancer Maternal Uncle   ? ? ?  Social History ?Social History  ? ?Tobacco Use  ? Smoking status: Never  ? Smokeless tobacco: Never  ?Vaping Use  ? Vaping Use: Never used  ?Substance Use Topics  ? Alcohol use: Yes  ?  Alcohol/week: 0.0 standard drinks  ?  Comment: Occasionally (light)  ? Drug use: No  ? ? ? ?Allergies   ?Monistat [miconazole], Other, and Tramadol ? ? ?Review of Systems ?Review of Systems  ?Constitutional:  Negative for fever.  ?Respiratory:  Positive for cough and chest tightness. Negative for shortness of breath.   ?Cardiovascular:  Negative for chest pain and palpitations.  ?All other systems reviewed and are negative. ? ? ?Physical Exam ?Triage Vital Signs ?ED Triage Vitals  ?Enc Vitals Group  ?    BP 04/19/21 1506 132/84  ?   Pulse Rate 04/19/21 1506 74  ?   Resp 04/19/21 1506 16  ?   Temp 04/19/21 1506 98.1 ?F (36.7 ?C)  ?   Temp Source 04/19/21 1506 Temporal  ?   SpO2 04/19/21 1506 98 %  ?   Weight --   ?   Height --   ?   Head Circumference --   ?   Peak Flow --   ?   Pain Score 04/19/21 1507 3  ?   Pain Loc --   ?   Pain Edu? --   ?   Excl. in GC? --   ? ?No data found. ? ?Updated Vital Signs ?BP 132/84 (BP Location: Left Arm)   Pulse 74   Temp 98.1 ?F (36.7 ?C) (Temporal)   Resp 16   SpO2 98%  ? ?Visual Acuity ?Right Eye Distance:   ?Left Eye Distance:   ?Bilateral Distance:   ? ?Right Eye Near:   ?Left Eye Near:    ?Bilateral Near:    ? ?Physical Exam ?Vitals and nursing note reviewed.  ?Constitutional:   ?   General: She is not in acute distress. ?   Appearance: Normal appearance. She is well-developed. She is obese. She is not ill-appearing or toxic-appearing.  ?HENT:  ?   Head: Normocephalic and atraumatic.  ?   Right Ear: Tympanic membrane, ear canal and external ear normal. There is no impacted cerumen.  ?   Left Ear: Tympanic membrane, ear canal and external ear normal. There is no impacted cerumen.  ?   Nose: Nose normal. No congestion or rhinorrhea.  ?   Comments: No sinus tenderness ?   Mouth/Throat:  ?   Mouth: Mucous membranes are moist.  ?   Pharynx: Oropharynx is clear. No oropharyngeal exudate or posterior oropharyngeal erythema.  ?Eyes:  ?   Extraocular Movements: Extraocular movements intact.  ?   Conjunctiva/sclera: Conjunctivae normal.  ?   Pupils: Pupils are equal, round, and reactive to light.  ?Cardiovascular:  ?   Rate and Rhythm: Normal rate and regular rhythm.  ?   Heart sounds: No murmur heard. ?Pulmonary:  ?   Effort: Pulmonary effort is normal. No respiratory distress.  ?   Breath sounds: Normal breath sounds. No stridor. No wheezing, rhonchi or rales.  ?   Comments: Minimal wheezing heard audibly from upper airway without stethoscope, but no adventitious breath sounds to  posterior lung exam. ?No stridor. ?No tripoding or drooling. ?Chest:  ?   Chest wall: No tenderness.  ?Abdominal:  ?   Palpations: Abdomen is soft.  ?Musculoskeletal:     ?   General: No swelling.  ?   Cervical back:  Normal range of motion and neck supple. No rigidity or tenderness.  ?Lymphadenopathy:  ?   Cervical: No cervical adenopathy.  ?Skin: ?   General: Skin is warm and dry.  ?   Capillary Refill: Capillary refill takes less than 2 seconds.  ?   Coloration: Skin is not jaundiced.  ?   Findings: No erythema or rash.  ?Neurological:  ?   General: No focal deficit present.  ?   Mental Status: She is alert and oriented to person, place, and time.  ?Psychiatric:     ?   Mood and Affect: Mood normal.  ? ? ? ?UC Treatments / Results  ?Labs ?(all labs ordered are listed, but only abnormal results are displayed) ?Labs Reviewed - No data to display ? ?EKG ? ? ?Radiology ?No results found. ? ?Procedures ?Procedures (including critical care time) ? ?Medications Ordered in UC ?Medications - No data to display ? ?Initial Impression / Assessment and Plan / UC Course  ?I have reviewed the triage vital signs and the nursing notes. ? ?Pertinent labs & imaging results that were available during my care of the patient were reviewed by me and considered in my medical decision making (see chart for details). ? ?  ? ?Acute tracheobronchitis - suspect inflammation of main bronchus of lung. No indicaiton for CXR on exam. Will do short burst of prednisone x 3 days, montelukast at night as needed for up to 14 days. Pt understands cough syrup only for night time use to help encouraged adequate sleep, side effects discussed. May continue mucinex OTC. Increase water intake. F/U with PCP or in clinic here if symptoms persist. ER precautions reviewed.  ? ?Final Clinical Impressions(s) / UC Diagnoses  ? ?Final diagnoses:  ?Acute tracheobronchitis  ? ? ? ?Discharge Instructions   ? ?  ?You have acute tracheobronchitis, which is inflammation  of the main part of your windpipe. ?Please take one tab of prednisone once daily x 3 days. ?Take montelukast once every night.  ?Continue your mucinex twice daily with plenty of water. ?I have called in coug

## 2021-04-19 NOTE — ED Triage Notes (Signed)
Patient presents to Urgent Care with complaints of cough x 10 days. Treating cough with mucinex, nyquil, tessalon, dayquil with no relief.  ? ?Denies fever.  ?

## 2021-04-19 NOTE — Discharge Instructions (Signed)
You have acute tracheobronchitis, which is inflammation of the main part of your windpipe. ?Please take one tab of prednisone once daily x 3 days. ?Take montelukast once every night.  ?Continue your mucinex twice daily with plenty of water. ?I have called in cough medication, take this only as needed, and only at night as it is sedating. ?If your symptoms persist, please follow up with PCP or return to clinic. ?Vaporizer or warm mist from a hot shower may be helpful. ?Eucalyptus and honey tea can also help. ?

## 2021-04-20 ENCOUNTER — Ambulatory Visit: Payer: 59 | Admitting: Family

## 2021-04-23 ENCOUNTER — Ambulatory Visit: Payer: Self-pay

## 2021-04-23 ENCOUNTER — Telehealth: Payer: Self-pay | Admitting: Family Medicine

## 2021-04-23 MED ORDER — PREDNISONE 20 MG PO TABS: 20.0000 mg | ORAL_TABLET | Freq: Every day | ORAL | 0 refills | Status: AC

## 2021-04-23 MED ORDER — DOXYCYCLINE HYCLATE 100 MG PO CAPS: 100.0000 mg | ORAL_CAPSULE | Freq: Two times a day (BID) | ORAL | 0 refills | Status: DC

## 2021-04-23 NOTE — Telephone Encounter (Signed)
Called patient per Cala Bradford, NP instructions. Spoke with patient and she states she continues to have cough since last visit cough has not resolved. Denies fever or SOB. Pharmacy verified, new medications sent to pharmacy. Instructed if she continues to have cough follow-up per provider recommendations. Pt voiced understanding.  ?

## 2021-04-23 NOTE — Telephone Encounter (Signed)
Patient seen 3 days ago and had has no improvement of symptoms and although denies SOB or wheezing. Agreed to extend prednisone (treated with 3 days) and Doxycyline symptoms present for nearly 2 weeks at this point. Patient advised by RN to return to clinic as needed or immediately if symptoms worsen. ?

## 2021-05-05 ENCOUNTER — Ambulatory Visit: Payer: 59 | Admitting: Family Medicine

## 2021-05-05 ENCOUNTER — Encounter: Payer: Self-pay | Admitting: Family Medicine

## 2021-05-05 DIAGNOSIS — B370 Candidal stomatitis: Secondary | ICD-10-CM | POA: Diagnosis not present

## 2021-05-05 MED ORDER — NYSTATIN 100000 UNIT/ML MT SUSP: OROMUCOSAL | 0 refills | Status: DC

## 2021-05-05 NOTE — Patient Instructions (Signed)
Use the nystatin mouthwash three times daily  ?Swallow it also  ? ?Update if not starting to improve in a week or if worsening   ? ?Drink fluids and take care of yourself  ?Rest your voice when you can  ? ?

## 2021-05-05 NOTE — Assessment & Plan Note (Signed)
S/p abx and steroids with strep and bronchitis ? ?Some vague throat discomfort  ?White non scrapable coat in tongue  ?Px nystatin solunl to swish and swallow  ?Update if not starting to improve in a week or if worsening  ?Handout given  ?Will swap out toothbrush ? ?

## 2021-05-05 NOTE — Progress Notes (Signed)
? ?Subjective:  ? ? Patient ID: Joann BRASSELL, female    DOB: [birth year], 51 y.o.   MRN: [id number] ? ?HPI ?Pt presents for cough and throat pain  ? ?Wt Readings from Last 3 Encounters:  ?05/05/21 188 lb 8 oz (85.5 kg)  ?03/29/21 185 lb (83.9 kg)  ?12/17/20 186 lb 2 oz (84.4 kg)  ? ?29.52 kg/m? ? ?Had strep on 03/29/21 tx with augmentin  ?Was seen in ER on 4/10 -after resp infx for 10 days (thinks it was separate from strep) ? ?Family caught it at a funeral  ? ?Dx with bronchitis ?Acute tracheobronchitis - suspect inflammation of main bronchus of lung. No indicaiton for CXR on exam. Will do short burst of prednisone x 3 days, montelukast at night as needed for up to 14 days. Pt understands cough syrup only for night time use to help encouraged adequate sleep, side effects discussed. May continue mucinex OTC. Increase water intake. F/U with PCP or in clinic here if symptoms persist. ER precautions reviewed.  ? ?Feels much better in terms of congestion and cough  ?Her throat hurts (from coughing and talking)  ?Feels like her epiglottis is inflamed ?Still occ spasms of cough  ? ?No fever ?No sob  ?No longer taking singulair  ? ?Not a lot of allergies  ?Taking ibuprofen for throat and it helps  ? ?Lots of fluids ? ?Patient Active Problem List  ? Diagnosis Date Noted  ? Thrush 05/05/2021  ? Strep pharyngitis 03/29/2021  ? Sore throat 03/29/2021  ? Current use of proton pump inhibitor 12/17/2020  ? Hyperlipidemia 08/14/2019  ? Routine general medical examination at a health care facility 11/20/2011  ? MIGRAINE WITH AURA 02/11/2009  ? GERD 10/24/2006  ? ?Past Medical History:  ?Diagnosis Date  ? Elevated blood pressure reading without diagnosis of hypertension   ? GERD (gastroesophageal reflux disease)   ? Migraine with aura, without mention of intractable migraine without mention of status migrainosus   ? Pre-eclampsia   ? Temporomandibular joint disorders, unspecified   ? ?History reviewed. No pertinent surgical  history. ?Social History  ? ?Tobacco Use  ? Smoking status: Never  ? Smokeless tobacco: Never  ?Vaping Use  ? Vaping Use: Never used  ?Substance Use Topics  ? Alcohol use: Yes  ?  Alcohol/week: 0.0 standard drinks  ?  Comment: Occasionally (light)  ? Drug use: No  ? ?Family History  ?Problem Relation Age of Onset  ? Hypertension Mother   ? Colonic polyp Mother   ? Stroke Father 24  ?     x 2  ? Diabetes type II Father   ? Breast cancer Maternal Grandmother   ? Stroke Paternal Grandmother 37  ? Diabetes Paternal Grandfather   ? Colon cancer Maternal Aunt   ? Colon cancer Maternal Uncle   ? ?Allergies  ?Allergen Reactions  ? Monistat [Miconazole]   ? Other   ?  Other reaction(s): Unknown  ? Tramadol   ?  Nausea and vomiting  ? ?Current Outpatient Medications on File Prior to Visit  ?Medication Sig Dispense Refill  ? ibuprofen (ADVIL,MOTRIN) 200 MG tablet Take 200 mg by mouth as needed.    ? montelukast (SINGULAIR) 10 MG tablet Take 1 tablet (10 mg total) by mouth at bedtime. 14 tablet 0  ? Multiple Vitamin (MULTIVITAMIN) tablet Take 1 tablet by mouth daily.    ? pantoprazole (PROTONIX) 40 MG tablet Take 1 tablet (40 mg total) by mouth daily. 90 tablet 3  ? ?  No current facility-administered medications on file prior to visit.  ?  ?Review of Systems  ?Constitutional:  Positive for fatigue.  ?HENT:  Positive for postnasal drip, sore throat and voice change. Negative for congestion, rhinorrhea, sinus pressure, sinus pain and trouble swallowing.   ?Respiratory:  Positive for cough. Negative for shortness of breath, wheezing and stridor.   ?Neurological:  Negative for headaches.  ? ?   ?Objective:  ? Physical Exam ?Constitutional:   ?   General: She is not in acute distress. ?   Appearance: Normal appearance. She is not ill-appearing.  ?HENT:  ?   Head: Normocephalic and atraumatic.  ?   Comments: No sinus tenderness ?   Right Ear: Tympanic membrane, ear canal and external ear normal.  ?   Left Ear: Tympanic membrane, ear  canal and external ear normal.  ?   Nose: No congestion.  ?   Comments: Boggy nares ?   Mouth/Throat:  ?   Mouth: Mucous membranes are moist.  ?   Pharynx: No oropharyngeal exudate or posterior oropharyngeal erythema.  ?   Comments: Thick appearing white coating on back 1/2 of tongue consistent with thrush  ?None noted in throat of buccal mucosa ?Eyes:  ?   General:     ?   Right eye: No discharge.     ?   Left eye: No discharge.  ?   Conjunctiva/sclera: Conjunctivae normal.  ?   Pupils: Pupils are equal, round, and reactive to light.  ?Cardiovascular:  ?   Rate and Rhythm: Normal rate and regular rhythm.  ?   Heart sounds: Normal heart sounds.  ?Pulmonary:  ?   Effort: Pulmonary effort is normal. No respiratory distress.  ?   Breath sounds: Normal breath sounds. No wheezing or rales.  ?Musculoskeletal:  ?   Cervical back: Neck supple. No tenderness.  ?Lymphadenopathy:  ?   Cervical: No cervical adenopathy.  ?Skin: ?   General: Skin is warm and dry.  ?   Findings: No erythema or rash.  ?Neurological:  ?   Mental Status: She is alert.  ?   Cranial Nerves: No cranial nerve deficit.  ?Psychiatric:     ?   Mood and Affect: Mood normal.  ? ? ? ? ? ?   ?Assessment & Plan:  ? ?Problem List Items Addressed This Visit   ? ?  ? Digestive  ? Thrush  ?  S/p abx and steroids with strep and bronchitis ? ?Some vague throat discomfort  ?White non scrapable coat in tongue  ?Px nystatin solunl to swish and swallow  ?Update if not starting to improve in a week or if worsening  ?Handout given  ?Will swap out toothbrush ? ? ?  ?  ? Relevant Medications  ? nystatin (MYCOSTATIN) 100000 UNIT/ML suspension  ? ? ?

## 2021-10-20 ENCOUNTER — Other Ambulatory Visit: Payer: Self-pay | Admitting: Family Medicine

## 2021-10-28 ENCOUNTER — Other Ambulatory Visit: Payer: Self-pay | Admitting: Obstetrics and Gynecology

## 2021-10-28 DIAGNOSIS — N6489 Other specified disorders of breast: Secondary | ICD-10-CM

## 2021-10-29 ENCOUNTER — Other Ambulatory Visit: Payer: Self-pay | Admitting: Obstetrics and Gynecology

## 2021-10-29 DIAGNOSIS — R928 Other abnormal and inconclusive findings on diagnostic imaging of breast: Secondary | ICD-10-CM

## 2021-12-10 ENCOUNTER — Ambulatory Visit: Payer: 59

## 2021-12-10 ENCOUNTER — Ambulatory Visit
Admission: RE | Admit: 2021-12-10 | Discharge: 2021-12-10 | Disposition: A | Discharge status: Home / Self Care | Payer: 59 | Source: Ambulatory Visit | Attending: Obstetrics and Gynecology | Admitting: Obstetrics and Gynecology

## 2021-12-10 DIAGNOSIS — R928 Other abnormal and inconclusive findings on diagnostic imaging of breast: Secondary | ICD-10-CM

## 2021-12-30 ENCOUNTER — Other Ambulatory Visit: Payer: Self-pay | Admitting: Family Medicine

## 2021-12-30 NOTE — Telephone Encounter (Signed)
Spoke to pt, scheduled cpe for 01/12/22 

## 2021-12-30 NOTE — Telephone Encounter (Signed)
Last CPE was 12/17/20, please schedule CPE (labs prior if possible) and then route back to me to refill, thanks

## 2022-01-05 ENCOUNTER — Telehealth: Payer: Self-pay | Admitting: Family Medicine

## 2022-01-05 DIAGNOSIS — Z0000 Encounter for general adult medical examination without abnormal findings: Secondary | ICD-10-CM

## 2022-01-05 DIAGNOSIS — E7800 Pure hypercholesterolemia, unspecified: Secondary | ICD-10-CM

## 2022-01-05 DIAGNOSIS — Z79899 Other long term (current) drug therapy: Secondary | ICD-10-CM

## 2022-01-05 NOTE — Telephone Encounter (Signed)
-----   Message from Ronalee Red, RT sent at 12/31/2021 12:30 PM EST ----- Regarding: Fri 12/29 lab Patient is scheduled for cpx, please order future labs.  Thanks, Jae Dire

## 2022-01-07 ENCOUNTER — Ambulatory Visit (INDEPENDENT_AMBULATORY_CARE_PROVIDER_SITE_OTHER): Payer: 59

## 2022-01-07 ENCOUNTER — Other Ambulatory Visit (INDEPENDENT_AMBULATORY_CARE_PROVIDER_SITE_OTHER): Payer: 59

## 2022-01-07 DIAGNOSIS — Z79899 Other long term (current) drug therapy: Secondary | ICD-10-CM | POA: Diagnosis not present

## 2022-01-07 DIAGNOSIS — Z0000 Encounter for general adult medical examination without abnormal findings: Secondary | ICD-10-CM

## 2022-01-07 DIAGNOSIS — Z23 Encounter for immunization: Secondary | ICD-10-CM | POA: Diagnosis not present

## 2022-01-07 DIAGNOSIS — E7800 Pure hypercholesterolemia, unspecified: Secondary | ICD-10-CM

## 2022-01-07 LAB — COMPREHENSIVE METABOLIC PANEL
ALT: 22 U/L (ref 0–35)
AST: 17 U/L (ref 0–37)
Albumin: 4.1 g/dL (ref 3.5–5.2)
Alkaline Phosphatase: 52 U/L (ref 39–117)
BUN: 10 mg/dL (ref 6–23)
CO2: 27 mEq/L (ref 19–32)
Calcium: 9.3 mg/dL (ref 8.4–10.5)
Chloride: 104 mEq/L (ref 96–112)
Creatinine, Ser: 0.59 mg/dL (ref 0.40–1.20)
GFR: 103.87 mL/min (ref >60.00)
Glucose, Bld: 96 mg/dL (ref 70–99)
Potassium: 4.2 mEq/L (ref 3.5–5.1)
Sodium: 139 mEq/L (ref 135–145)
Total Bilirubin: 0.6 mg/dL (ref 0.2–1.2)
Total Protein: 6.5 g/dL (ref 6.0–8.3)

## 2022-01-07 LAB — LIPID PANEL
Cholesterol: 193 mg/dL (ref 0–200)
HDL: 38.5 mg/dL — ABNORMAL LOW (ref >39.00)
LDL Cholesterol: 133 mg/dL — ABNORMAL HIGH (ref 0–99)
NonHDL: 154.42
Total CHOL/HDL Ratio: 5
Triglycerides: 106 mg/dL (ref 0.0–149.0)
VLDL: 21.2 mg/dL (ref 0.0–40.0)

## 2022-01-07 LAB — CBC WITH DIFFERENTIAL/PLATELET
Basophils Absolute: 0.1 10*3/uL (ref 0.0–0.1)
Basophils Relative: 1.1 % (ref 0.0–3.0)
Eosinophils Absolute: 0.1 10*3/uL (ref 0.0–0.7)
Eosinophils Relative: 1.1 % (ref 0.0–5.0)
HCT: 42 % (ref 36.0–46.0)
Hemoglobin: 14 g/dL (ref 12.0–15.0)
Lymphocytes Relative: 29.4 % (ref 12.0–46.0)
Lymphs Abs: 1.7 10*3/uL (ref 0.7–4.0)
MCHC: 33.5 g/dL (ref 30.0–36.0)
MCV: 87.6 fl (ref 78.0–100.0)
Monocytes Absolute: 0.6 10*3/uL (ref 0.1–1.0)
Monocytes Relative: 10.5 % (ref 3.0–12.0)
Neutro Abs: 3.4 10*3/uL (ref 1.4–7.7)
Neutrophils Relative %: 57.9 % (ref 43.0–77.0)
Platelets: 456 10*3/uL — ABNORMAL HIGH (ref 150.0–400.0)
RBC: 4.79 Mil/uL (ref 3.87–5.11)
RDW: 13.2 % (ref 11.5–15.5)
WBC: 5.9 10*3/uL (ref 4.0–10.5)

## 2022-01-07 LAB — TSH: TSH: 1.35 u[IU]/mL (ref 0.35–5.50)

## 2022-01-07 LAB — VITAMIN B12: Vitamin B-12: 279 pg/mL (ref 211–911)

## 2022-01-12 ENCOUNTER — Encounter: Payer: Self-pay | Admitting: Family Medicine

## 2022-01-12 ENCOUNTER — Ambulatory Visit (INDEPENDENT_AMBULATORY_CARE_PROVIDER_SITE_OTHER): Payer: 59 | Admitting: Family Medicine

## 2022-01-12 VITALS — BP 134/84 | HR 70 | Temp 97.3°F | Ht 66.0 in | Wt 191.5 lb

## 2022-01-12 DIAGNOSIS — Z0000 Encounter for general adult medical examination without abnormal findings: Secondary | ICD-10-CM | POA: Diagnosis not present

## 2022-01-12 DIAGNOSIS — Z79899 Other long term (current) drug therapy: Secondary | ICD-10-CM

## 2022-01-12 DIAGNOSIS — K219 Gastro-esophageal reflux disease without esophagitis: Secondary | ICD-10-CM | POA: Diagnosis not present

## 2022-01-12 DIAGNOSIS — E7800 Pure hypercholesterolemia, unspecified: Secondary | ICD-10-CM | POA: Diagnosis not present

## 2022-01-12 DIAGNOSIS — R7989 Other specified abnormal findings of blood chemistry: Secondary | ICD-10-CM | POA: Insufficient documentation

## 2022-01-12 NOTE — Progress Notes (Signed)
Subjective:    Patient ID: Joann Davis, female    DOB: Mar 14, 1970, 52 y.o.   MRN: [id number]  HPI Here for health maintenance exam and to review chronic medical problems    Wt Readings from Last 3 Encounters:  01/12/22 191 lb 8 oz (86.9 kg)  05/05/21 188 lb 8 oz (85.5 kg)  03/29/21 185 lb (83.9 kg)   30.91 kg/m  Trying to take care of herself  Understaffed at work [employer] and tired from her schedule   Daughter just turned 49     Immunization History  Administered Date(s) Administered   Influenza Split 12/02/2011   Influenza, Quadrivalent, Recombinant, Inj, Pf 11/07/2018   Influenza,inj,Quad PF,6+ Mos 04/01/2015, 01/01/2016, 10/05/2016, 11/05/2017, 12/17/2020, 01/07/2022   Influenza-Unspecified 10/10/2012   Moderna Sars-Covid-2 Vaccination 03/08/2019, 04/05/2019, 12/27/2019   Tdap 12/02/2011, 05/19/2020   Health Maintenance Due  Topic Date Due   Hepatitis C Screening  Never done   Zoster Vaccines- Shingrix (1 of 2) Never done   PAP SMEAR-Modifier  01/20/2019   COVID-19 Vaccine (5 - 2023-24 season) 09/10/2021   Shingrix: interested   Pap  2021? She thinks at gyn, Had gyn visit in nov ,  Still having periods - a [place] shorter  Husb had vasectomy   Mammogram 12/2021  had b9 cyst  Self breast exam:  no lumps   Colonoscopy 07/2019 with 10 y recall   BP Readings from Last 3 Encounters:  01/12/22 134/84  05/05/21 122/80  04/19/21 132/84   Pulse Readings from Last 3 Encounters:  01/12/22 70  05/05/21 79  04/19/21 74    Hyperlipidemia Lab Results  Component Value Date   CHOL 193 01/07/2022   CHOL 191 12/17/2020   CHOL 192 08/06/2019   Lab Results  Component Value Date   HDL 38.50 (L) 01/07/2022   HDL 44.50 12/17/2020   HDL 36.80 (L) 08/06/2019   Lab Results  Component Value Date   LDLCALC 133 (H) 01/07/2022   LDLCALC 128 (H) 12/17/2020   LDLCALC 128 (H) 08/06/2019   Lab Results  Component Value Date   TRIG 106.0 01/07/2022   TRIG 96.0  12/17/2020   TRIG 136.0 08/06/2019   Lab Results  Component Value Date   CHOLHDL 5 01/07/2022   CHOLHDL 4 12/17/2020   CHOLHDL 5 08/06/2019   No results found for: "LDLDIRECT"  Diet controlled  Not much red meat  Limited fried food   The 10-year ASCVD risk score (Arnett DK, et al., 2019) is: 2.3%   Values used to calculate the score:     Age: 67 years     Sex: Female     Is Non-Hispanic African American: No     Diabetic: No     Tobacco smoker: No     Systolic Blood Pressure: 034 mmHg     Is BP treated: No     HDL Cholesterol: 38.5 mg/dL     Total Cholesterol: 193 mg/dL  Has stroke in father's side of family  Mother has CAD- had bypass Dx in mid 18s      H/o ppi use  (cannot come off it right now)  Stress keeps reflux active  Protonix Lab Results  Component Value Date   VITAMINB12 279 01/07/2022  Mvi at times or flintstones    Past elevated platelets Lab Results  Component Value Date   WBC 5.9 01/07/2022   HGB 14.0 01/07/2022   HCT 42.0 01/07/2022   MCV 87.6 01/07/2022   PLT 456.0 (H)  01/07/2022  Donates blood 4 times per year  Improved   No h/o blood clots   Lab Results  Component Value Date   CREATININE 0.59 01/07/2022   BUN 10 01/07/2022   NA 139 01/07/2022   K 4.2 01/07/2022   CL 104 01/07/2022   CO2 27 01/07/2022   Lab Results  Component Value Date   ALT 22 01/07/2022   AST 17 01/07/2022   ALKPHOS 52 01/07/2022   BILITOT 0.6 01/07/2022   Glucose of 96   Lab Results  Component Value Date   TSH 1.35 01/07/2022     Patient Active Problem List   Diagnosis Date Noted   Elevated platelet count 01/12/2022   Current use of proton pump inhibitor 12/17/2020   Hyperlipidemia 08/14/2019   Routine general medical examination at a health care facility 11/20/2011   MIGRAINE WITH AURA 02/11/2009   GERD 10/24/2006   Past Medical History:  Diagnosis Date   Elevated blood pressure reading without diagnosis of hypertension    GERD  (gastroesophageal reflux disease)    Migraine with aura, without mention of intractable migraine without mention of status migrainosus    Pre-eclampsia    Temporomandibular joint disorders, unspecified    History reviewed. No pertinent surgical history. Social History   Tobacco Use   Smoking status: Never   Smokeless tobacco: Never  Vaping Use   Vaping Use: Never used  Substance Use Topics   Alcohol use: Yes    Alcohol/week: 0.0 standard drinks of alcohol    Comment: Occasionally (light)   Drug use: No   Family History  Problem Relation Age of Onset   Hypertension Mother    Colonic polyp Mother    Stroke Father 34       x 2   Diabetes type II Father    Breast cancer Maternal Grandmother    Stroke Paternal Grandmother 72   Diabetes Paternal Grandfather    Colon cancer Maternal Aunt    Colon cancer Maternal Uncle    Allergies  Allergen Reactions   Monistat [Miconazole]    Other     Other reaction(s): Unknown   Tramadol     Nausea and vomiting   Current Outpatient Medications on File Prior to Visit  Medication Sig Dispense Refill   ibuprofen (ADVIL,MOTRIN) 200 MG tablet Take 200 mg by mouth as needed.     Multiple Vitamin (MULTIVITAMIN) tablet Take 1 tablet by mouth daily.     pantoprazole (PROTONIX) 40 MG tablet TAKE 1 TABLET BY MOUTH DAILY 90 tablet 0   No current facility-administered medications on file prior to visit.      Review of Systems  Constitutional:  Positive for fatigue. Negative for activity change, appetite change, fever and unexpected weight change.  HENT:  Negative for congestion, ear pain, rhinorrhea, sinus pressure and sore throat.   Eyes:  Negative for pain, redness and visual disturbance.  Respiratory:  Negative for cough, shortness of breath and wheezing.   Cardiovascular:  Negative for chest pain and palpitations.  Gastrointestinal:  Negative for abdominal pain, blood in stool, constipation and diarrhea.  Endocrine: Negative for polydipsia  and polyuria.  Genitourinary:  Negative for dysuria, frequency and urgency.  Musculoskeletal:  Negative for arthralgias, back pain and myalgias.  Skin:  Negative for pallor and rash.  Allergic/Immunologic: Negative for environmental allergies.  Neurological:  Negative for dizziness, syncope and headaches.  Hematological:  Negative for adenopathy. Does not bruise/bleed easily.  Psychiatric/Behavioral:  Negative for decreased concentration and dysphoric  mood. The patient is not nervous/anxious.        Objective:   Physical Exam Constitutional:      General: She is not in acute distress.    Appearance: Normal appearance. She is well-developed. She is obese. She is not ill-appearing or diaphoretic.  HENT:     Head: Normocephalic and atraumatic.     Right Ear: Tympanic membrane, ear canal and external ear normal.     Left Ear: Tympanic membrane, ear canal and external ear normal.     Nose: Nose normal. No congestion.     Mouth/Throat:     Mouth: Mucous membranes are moist.     Pharynx: Oropharynx is clear. No posterior oropharyngeal erythema.  Eyes:     General: No scleral icterus.    Extraocular Movements: Extraocular movements intact.     Conjunctiva/sclera: Conjunctivae normal.     Pupils: Pupils are equal, round, and reactive to light.  Neck:     Thyroid: No thyromegaly.     Vascular: No carotid bruit or JVD.  Cardiovascular:     Rate and Rhythm: Normal rate and regular rhythm.     Pulses: Normal pulses.     Heart sounds: Normal heart sounds.     No gallop.  Pulmonary:     Effort: Pulmonary effort is normal. No respiratory distress.     Breath sounds: Normal breath sounds. No wheezing.     Comments: Good air exch Chest:     Chest wall: No tenderness.  Abdominal:     General: Bowel sounds are normal. There is no distension or abdominal bruit.     Palpations: Abdomen is soft. There is no mass.     Tenderness: There is no abdominal tenderness.     Hernia: No hernia is  present.  Genitourinary:    Comments: Breast exam: No mass, nodules, thickening, tenderness, bulging, retraction, inflamation, nipple discharge or skin changes noted.  No axillary or clavicular LA.     Musculoskeletal:        General: No tenderness. Normal range of motion.     Cervical back: Normal range of motion and neck supple. No rigidity. No muscular tenderness.     Right lower leg: No edema.     Left lower leg: No edema.     Comments: No kyphosis   Lymphadenopathy:     Cervical: No cervical adenopathy.  Skin:    General: Skin is warm and dry.     Coloration: Skin is not pale.     Findings: No erythema or rash.     Comments: Solar lentigines diffusely   Neurological:     Mental Status: She is alert. Mental status is at baseline.     Cranial Nerves: No cranial nerve deficit.     Motor: No abnormal muscle tone.     Coordination: Coordination normal.     Gait: Gait normal.     Deep Tendon Reflexes: Reflexes are normal and symmetric. Reflexes normal.  Psychiatric:        Mood and Affect: Mood normal.        Cognition and Memory: Cognition and memory normal.           Assessment & Plan:   Problem List Items Addressed This Visit       Digestive   GERD    Protonix 40 mg daily  Unable to stop this in the past  Watching B12        Other   Current use of proton  pump inhibitor    Lab Results  Component Value Date   VITAMINB12 279 01/07/2022  Will start to supplement B12 1000 mcg daily       Elevated platelet count    456 This is down from prev No clinical changes Continue to watch      Hyperlipidemia    Disc goals for lipids and reasons to control them Rev last labs with pt Rev low sat fat diet in detail Ratio of 5 Considering coronary ca score to risk stratify  If inc risk would consider statin       Routine general medical examination at a health care facility - Primary    Reviewed health habits including diet and exercise and skin cancer  prevention Reviewed appropriate screening tests for age  Also reviewed health mt list, fam hx and immunization status , as well as social and family history   See HPI Labs reviewed  Plans to check on shingrix coverage Per pt pap was 2021 and yearly gyn visits  Mammogram 12/2021 up to date Colonoscopy 07/2019 up to date  Disc coronary ca score in light of cholesterol and fam hx

## 2022-01-12 NOTE — Assessment & Plan Note (Signed)
Disc goals for lipids and reasons to control them Rev last labs with pt Rev low sat fat diet in detail Ratio of 5 Considering coronary ca score to risk stratify  If inc risk would consider statin

## 2022-01-12 NOTE — Assessment & Plan Note (Signed)
Reviewed health habits including diet and exercise and skin cancer prevention Reviewed appropriate screening tests for age  Also reviewed health mt list, fam hx and immunization status , as well as social and family history   See HPI Labs reviewed  Plans to check on shingrix coverage Per pt pap was 2021 and yearly gyn visits  Mammogram 12/2021 up to date Colonoscopy 07/2019 up to date  Disc coronary ca score in light of cholesterol and fam hx

## 2022-01-12 NOTE — Patient Instructions (Addendum)
If you are interested in the shingles vaccine series (Shingrix), call your insurance or pharmacy to check on coverage and location it must be given.  If affordable - you can schedule it here or at your pharmacy depending on coverage   Take 1000 mcg of vitamin B12 every day   For cholesterol Avoid red meat/ fried foods/ egg yolks/ fatty breakfast meats/ butter, cheese and high fat dairy/ and shellfish     If you are interested in a cardiac calcium score let us know  Here is a handout   Get outdoors and exercise when you can  Keep walking the dog   Take care of yourself

## 2022-01-12 NOTE — Assessment & Plan Note (Signed)
Protonix 40 mg daily  Unable to stop this in the past  Watching B12

## 2022-01-12 NOTE — Assessment & Plan Note (Signed)
Lab Results  Component Value Date   VITAMINB12 279 01/07/2022   Will start to supplement B12 1000 mcg daily

## 2022-01-12 NOTE — Assessment & Plan Note (Signed)
456 This is down from prev No clinical changes Continue to watch

## 2022-03-29 ENCOUNTER — Other Ambulatory Visit: Payer: Self-pay | Admitting: Family Medicine

## 2022-09-27 ENCOUNTER — Ambulatory Visit: Payer: 59 | Admitting: Family Medicine

## 2022-09-27 ENCOUNTER — Encounter: Payer: Self-pay | Admitting: Family Medicine

## 2022-09-27 VITALS — BP 122/84 | HR 71 | Temp 97.9°F | Ht 66.0 in | Wt 191.0 lb

## 2022-09-27 DIAGNOSIS — K219 Gastro-esophageal reflux disease without esophagitis: Secondary | ICD-10-CM

## 2022-09-27 DIAGNOSIS — Z79899 Other long term (current) drug therapy: Secondary | ICD-10-CM | POA: Diagnosis not present

## 2022-09-27 DIAGNOSIS — Z23 Encounter for immunization: Secondary | ICD-10-CM | POA: Diagnosis not present

## 2022-09-27 MED ORDER — PANTOPRAZOLE SODIUM 40 MG PO TBEC: 40.0000 mg | DELAYED_RELEASE_TABLET | Freq: Every day | ORAL | 3 refills | Status: DC

## 2022-09-27 NOTE — Patient Instructions (Addendum)
Continue the vitamin B12   Make sure you get at least 2000 international units of vitamin D3 daily  If not in mvi -then add it   Drink lots of water I refilled protonix   Flu shot today

## 2022-09-27 NOTE — Assessment & Plan Note (Signed)
Has tried in past and was unable to stop protonix 40 mg daily  Stress from work is a trigger Reviewed diet choices  Refilled this  Encouraged to continue supplements Discussed long term effects-instructed to keep up fluids for her renal health as well

## 2022-09-27 NOTE — Assessment & Plan Note (Signed)
Lab Results  Component Value Date   VITAMINB12 279 01/07/2022   Pt continues B12 1000 mcg daily   Encouraged vit D 2000 international units daily also   Encouraged good fluid intake

## 2022-09-27 NOTE — Progress Notes (Signed)
Subjective:    Patient ID: Joann Davis, female    DOB: 1970-02-05, 52 y.o.   MRN: [id number]  HPI  Wt Readings from Last 3 Encounters:  09/27/22 191 lb (86.6 kg)  01/12/22 191 lb 8 oz (86.9 kg)  05/05/21 188 lb 8 oz (85.5 kg)   30.83 kg/m  Vitals:   09/27/22 1400  BP: 122/84  Pulse: 71  Temp: 97.9 F (36.6 C)  SpO2: 98%     Pt presents for follow up of chronic medical problems/ med refill -GERD Also wants a flu shot   Feeling ok   Takes protonix 40 mg daily for GERD If she misses a day- has significant rebound symptoms  Regurg of acid   Does watch out for certain foods No coffee in afternoon  No tomato sauce 2 d in a row  [place] does not bother her except ginger ale   Does not feel ready to wean this   Work is a [employer]   No abdominal pain at all     Lab Results  Component Value Date   VITAMINB12 279 01/07/2022  Still taking B12    Lab Results  Component Value Date   WBC 5.9 01/07/2022   HGB 14.0 01/07/2022   HCT 42.0 01/07/2022   MCV 87.6 01/07/2022   PLT 456.0 (H) 01/07/2022   Plt ct tends to be elevated  Hyperlipidemia Lab Results  Component Value Date   CHOL 193 01/07/2022   HDL 38.50 (L) 01/07/2022   LDLCALC 133 (H) 01/07/2022   TRIG 106.0 01/07/2022   CHOLHDL 5 01/07/2022       Patient Active Problem List   Diagnosis Date Noted   Elevated platelet count 01/12/2022   Current use of proton pump inhibitor 12/17/2020   Hyperlipidemia 08/14/2019   Routine general medical examination at a health care facility 11/20/2011   MIGRAINE WITH AURA 02/11/2009   GERD 10/24/2006   Past Medical History:  Diagnosis Date   Elevated blood pressure reading without diagnosis of hypertension    GERD (gastroesophageal reflux disease)    Migraine with aura, without mention of intractable migraine without mention of status migrainosus    Pre-eclampsia    Temporomandibular joint disorders, unspecified    History reviewed. No  pertinent surgical history. Social History   Tobacco Use   Smoking status: Never   Smokeless tobacco: Never  Vaping Use   Vaping status: Never Used  Substance Use Topics   Alcohol use: Yes    Alcohol/week: 0.0 standard drinks of alcohol    Comment: Occasionally (light)   Drug use: No   Family History  Problem Relation Age of Onset   Hypertension Mother    Colonic polyp Mother    Stroke Father 88       x 2   Diabetes type II Father    Breast cancer Maternal Grandmother    Stroke Paternal Grandmother 26   Diabetes Paternal Grandfather    Colon cancer Maternal Aunt    Colon cancer Maternal Uncle    Allergies  Allergen Reactions   Monistat [Miconazole]    Other     Other reaction(s): Unknown   Tramadol     Nausea and vomiting   Current Outpatient Medications on File Prior to Visit  Medication Sig Dispense Refill   cyanocobalamin (VITAMIN B12) 1000 MCG tablet Take 1,000 mcg by mouth daily.     ibuprofen (ADVIL,MOTRIN) 200 MG tablet Take 200 mg by mouth as needed.  Multiple Vitamin (MULTIVITAMIN) tablet Take 1 tablet by mouth daily.     No current facility-administered medications on file prior to visit.    Review of Systems  Constitutional:  Negative for activity change, appetite change, fatigue, fever and unexpected weight change.  HENT:  Negative for congestion, ear pain, rhinorrhea, sinus pressure and sore throat.   Eyes:  Negative for pain, redness and visual disturbance.  Respiratory:  Negative for cough, shortness of breath and wheezing.   Cardiovascular:  Negative for chest pain and palpitations.  Gastrointestinal:  Negative for abdominal pain, blood in stool, constipation and diarrhea.       Heartburn and regurgitation of acid if she misses her ppi  Endocrine: Negative for polydipsia and polyuria.  Genitourinary:  Negative for dysuria, frequency and urgency.  Musculoskeletal:  Negative for arthralgias, back pain and myalgias.  Skin:  Negative for pallor  and rash.  Allergic/Immunologic: Negative for environmental allergies.  Neurological:  Negative for dizziness, syncope and headaches.  Hematological:  Negative for adenopathy. Does not bruise/bleed easily.  Psychiatric/Behavioral:  Negative for decreased concentration and dysphoric mood. The patient is not nervous/anxious.        Objective:   Physical Exam Constitutional:      General: She is not in acute distress.    Appearance: Normal appearance. She is well-developed. She is obese. She is not ill-appearing or diaphoretic.  HENT:     Head: Normocephalic and atraumatic.  Eyes:     Conjunctiva/sclera: Conjunctivae normal.     Pupils: Pupils are equal, round, and reactive to light.  Neck:     Thyroid: No thyromegaly.     Vascular: No carotid bruit or JVD.  Cardiovascular:     Rate and Rhythm: Normal rate and regular rhythm.     Heart sounds: Normal heart sounds.     No gallop.  Pulmonary:     Effort: Pulmonary effort is normal. No respiratory distress.     Breath sounds: Normal breath sounds. No wheezing or rales.  Abdominal:     General: There is no distension or abdominal bruit.     Palpations: Abdomen is soft. There is no mass.     Tenderness: There is no abdominal tenderness. There is no guarding or rebound.  Musculoskeletal:     Cervical back: Normal range of motion and neck supple.     Right lower leg: No edema.     Left lower leg: No edema.  Lymphadenopathy:     Cervical: No cervical adenopathy.  Skin:    General: Skin is warm and dry.     Coloration: Skin is not pale.     Findings: No rash.  Neurological:     Mental Status: She is alert.     Coordination: Coordination normal.     Deep Tendon Reflexes: Reflexes are normal and symmetric. Reflexes normal.  Psychiatric:        Mood and Affect: Mood normal.           Assessment & Plan:   Problem List Items Addressed This Visit       Digestive   GERD - Primary    Has tried in past and was unable to stop  protonix 40 mg daily  Stress from work is a trigger Reviewed diet choices  Refilled this  Encouraged to continue supplements Discussed long term effects-instructed to keep up fluids for her renal health as well       Relevant Medications   pantoprazole (PROTONIX) 40 MG tablet  Other   Current use of proton pump inhibitor    Lab Results  Component Value Date   VITAMINB12 279 01/07/2022   Pt continues B12 1000 mcg daily   Encouraged vit D 2000 international units daily also   Encouraged good fluid intake

## 2023-06-16 ENCOUNTER — Ambulatory Visit: Admitting: Family Medicine

## 2023-06-16 ENCOUNTER — Encounter: Payer: Self-pay | Admitting: Family Medicine

## 2023-06-16 VITALS — BP 128/82 | HR 68 | Temp 98.1°F | Ht 66.0 in | Wt 192.0 lb

## 2023-06-16 DIAGNOSIS — R519 Headache, unspecified: Secondary | ICD-10-CM

## 2023-06-16 DIAGNOSIS — R7989 Other specified abnormal findings of blood chemistry: Secondary | ICD-10-CM

## 2023-06-16 LAB — CBC WITH DIFFERENTIAL/PLATELET
Basophils Absolute: 0 10*3/uL (ref 0.0–0.1)
Basophils Relative: 0.6 % (ref 0.0–3.0)
Eosinophils Absolute: 0.1 10*3/uL (ref 0.0–0.7)
Eosinophils Relative: 1.8 % (ref 0.0–5.0)
HCT: 40.1 % (ref 36.0–46.0)
Hemoglobin: 13.4 g/dL (ref 12.0–15.0)
Lymphocytes Relative: 23.4 % (ref 12.0–46.0)
Lymphs Abs: 1.5 10*3/uL (ref 0.7–4.0)
MCHC: 33.4 g/dL (ref 30.0–36.0)
MCV: 86.2 fl (ref 78.0–100.0)
Monocytes Absolute: 0.6 10*3/uL (ref 0.1–1.0)
Monocytes Relative: 9.1 % (ref 3.0–12.0)
Neutro Abs: 4.1 10*3/uL (ref 1.4–7.7)
Neutrophils Relative %: 65.1 % (ref 43.0–77.0)
Platelets: 391 10*3/uL (ref 150.0–400.0)
RBC: 4.65 Mil/uL (ref 3.87–5.11)
RDW: 14 % (ref 11.5–15.5)
WBC: 6.3 10*3/uL (ref 4.0–10.5)

## 2023-06-16 LAB — C-REACTIVE PROTEIN: CRP: <1 mg/dL (ref 0.5–20.0)

## 2023-06-16 LAB — SEDIMENTATION RATE: Sed Rate: 31 mm/h — ABNORMAL HIGH (ref 0–30)

## 2023-06-16 MED ORDER — TIZANIDINE HCL 4 MG PO TABS: 4.0000 mg | ORAL_TABLET | Freq: Three times a day (TID) | ORAL | 0 refills | Status: DC | PRN

## 2023-06-16 MED ORDER — PREDNISONE 10 MG PO TABS: ORAL_TABLET | ORAL | 0 refills | Status: DC

## 2023-06-16 NOTE — Assessment & Plan Note (Addendum)
 Left occipital pressure and pain  Very Radi tenderness on exam and some mild neck pain with right sided head tilt  Normal neuro exam Discussed possible headache syndrome, neck muscle pain or occipital neuralgia   Lab today for inflammation  Also cbc  Instructed to  Use ice  Sleep in neck neutral position Prednsione prescription 40 mg taper  Tizanidine for neck muscle spasm with caution of sedation   Update if not starting to improve in a week or if worsening  Call back and Er precautions noted in detail today

## 2023-06-16 NOTE — Assessment & Plan Note (Signed)
 Cbc today   Has had some posterior left sided headache

## 2023-06-16 NOTE — Patient Instructions (Signed)
 Continue supporting head for sleep   Use ice on painful area as often as possible  Try the tizanidine (muscle relaxer) - with caution of sedation   Take prednisone  as directed  May make you hyper and hungry   Update if not starting to improve in a week or if worsening    If severe- go to the ER  Labs today

## 2023-06-16 NOTE — Progress Notes (Signed)
 Subjective:    Patient ID: Joann Davis, female    DOB: 01-24-1970, 53 y.o.   MRN: [id number]  HPI  Wt Readings from Last 3 Encounters:  06/16/23 192 lb (87.1 kg)  09/27/22 191 lb (86.6 kg)  01/12/22 191 lb 8 oz (86.9 kg)   30.99 kg/m  Vitals:   06/16/23 1201  BP: 128/82  Pulse: 68  Temp: 98.1 F (36.7 C)  SpO2: 96%    Pt presents with c/o  Head pain- one week   This weekend woke up with pain over left occipital area  ? Slept wrong Getting worse  Maybe a Apfel tender to the touch  Same sort of pain as migraine but in different area (usually above eye)   Pressure and occational fleeting and sharp  No throbbing   Has taken ibuprofen  Helps a Schadler but not much  Has not tried ice or heat   Has changed sleep position- hard to get comfortable  Rolling a towel under neck   No stroke symptoms   Perhaps mild nausea-unsure if related / but bending over a lot     Can can hear crunching when she turns head Neck hurts a bit on that side now   No radiation to temple or face  Has had a few tick bites    (leaves red mark) -no bullseye  No fevers  ? Rash / also gets poison ivy recently   Lab Results  Component Value Date   WBC 5.9 01/07/2022   HGB 14.0 01/07/2022   HCT 42.0 01/07/2022   MCV 87.6 01/07/2022   PLT 456.0 (H) 01/07/2022   Lab Results  Component Value Date   NA 139 01/07/2022   K 4.2 01/07/2022   CO2 27 01/07/2022   GLUCOSE 96 01/07/2022   BUN 10 01/07/2022   CREATININE 0.59 01/07/2022   CALCIUM 9.3 01/07/2022   GFR 103.87 01/07/2022   GFRNONAA >60 02/17/2010      Patient Active Problem List   Diagnosis Date Noted   Occipital pain 06/16/2023   Elevated platelet count 01/12/2022   Current use of proton pump inhibitor 12/17/2020   Hyperlipidemia 08/14/2019   Routine general medical examination at a health care facility 11/20/2011   Migraine with aura 02/11/2009   GERD 10/24/2006   Past Medical History:  Diagnosis Date    Elevated blood pressure reading without diagnosis of hypertension    GERD (gastroesophageal reflux disease)    Migraine with aura, without mention of intractable migraine without mention of status migrainosus    Pre-eclampsia    Temporomandibular joint disorders, unspecified    History reviewed. No pertinent surgical history. Social History   Tobacco Use   Smoking status: Never   Smokeless tobacco: Never  Vaping Use   Vaping status: Never Used  Substance Use Topics   Alcohol use: Yes    Alcohol/week: 0.0 standard drinks of alcohol    Comment: Occasionally (light)   Drug use: No   Family History  Problem Relation Age of Onset   Hypertension Mother    Colonic polyp Mother    Stroke Father 49       x 2   Diabetes type II Father    Breast cancer Maternal Grandmother    Stroke Paternal Grandmother 77   Diabetes Paternal Grandfather    Colon cancer Maternal Aunt    Colon cancer Maternal Uncle    Allergies  Allergen Reactions   Monistat [Miconazole]    Other  Other reaction(s): Unknown   Tramadol     Nausea and vomiting   Current Outpatient Medications on File Prior to Visit  Medication Sig Dispense Refill   cyanocobalamin  (VITAMIN B12) 1000 MCG tablet Take 1,000 mcg by mouth daily.     ibuprofen (ADVIL,MOTRIN) 200 MG tablet Take 200 mg by mouth as needed.     Multiple Vitamin (MULTIVITAMIN) tablet Take 1 tablet by mouth daily.     pantoprazole  (PROTONIX ) 40 MG tablet Take 1 tablet (40 mg total) by mouth daily. 90 tablet 3   No current facility-administered medications on file prior to visit.    Review of Systems  Constitutional:  Negative for activity change, appetite change, fatigue, fever and unexpected weight change.  HENT:  Negative for congestion, ear pain, rhinorrhea, sinus pressure and sore throat.   Eyes:  Negative for pain, redness and visual disturbance.  Respiratory:  Negative for cough, shortness of breath and wheezing.   Cardiovascular:  Negative for  chest pain and palpitations.  Gastrointestinal:  Negative for abdominal pain, blood in stool, constipation and diarrhea.  Endocrine: Negative for polydipsia and polyuria.  Genitourinary:  Negative for dysuria, frequency and urgency.  Musculoskeletal:  Positive for neck pain. Negative for arthralgias, back pain, myalgias and neck stiffness.  Skin:  Negative for pallor and rash.  Allergic/Immunologic: Negative for environmental allergies.  Neurological:  Positive for headaches. Negative for dizziness and syncope.  Hematological:  Negative for adenopathy. Does not bruise/bleed easily.  Psychiatric/Behavioral:  Negative for decreased concentration and dysphoric mood. The patient is not nervous/anxious.        Objective:   Physical Exam Constitutional:      General: She is not in acute distress.    Appearance: Normal appearance. She is well-developed. She is obese. She is not ill-appearing or diaphoretic.  HENT:     Head: Normocephalic and atraumatic.     Comments: Mild tenderness left occiput area     Right Ear: Tympanic membrane, ear canal and external ear normal.     Left Ear: Tympanic membrane, ear canal and external ear normal.     Nose: Nose normal.     Mouth/Throat:     Pharynx: No oropharyngeal exudate.  Eyes:     General: No scleral icterus.       Right eye: No discharge.        Left eye: No discharge.     Conjunctiva/sclera: Conjunctivae normal.     Pupils: Pupils are equal, round, and reactive to light.     Comments: No nystagmus  Neck:     Thyroid : No thyromegaly.     Vascular: No carotid bruit or JVD.     Trachea: No tracheal deviation.     Comments: Mild left high cervical muscle tenderness with right sided head tilt   Cardiovascular:     Rate and Rhythm: Normal rate and regular rhythm.     Heart sounds: Normal heart sounds. No murmur heard. Pulmonary:     Effort: Pulmonary effort is normal. No respiratory distress.     Breath sounds: Normal breath sounds. No  wheezing or rales.  Abdominal:     General: Bowel sounds are normal. There is no distension.     Palpations: Abdomen is soft. There is no mass.     Tenderness: There is no abdominal tenderness.  Musculoskeletal:        General: No tenderness.     Cervical back: Full passive range of motion without pain, normal range of motion and  neck supple.  Lymphadenopathy:     Cervical: No cervical adenopathy.  Skin:    General: Skin is warm and dry.     Coloration: Skin is not pale.     Findings: No rash.  Neurological:     Mental Status: She is alert and oriented to person, place, and time.     Cranial Nerves: No cranial nerve deficit, dysarthria or facial asymmetry.     Sensory: Sensation is intact. No sensory deficit.     Motor: No weakness, tremor, atrophy, abnormal muscle tone, seizure activity or pronator drift.     Coordination: Romberg sign negative. Coordination normal.     Gait: Gait normal.     Deep Tendon Reflexes: Reflexes are normal and symmetric. Reflexes normal.     Comments: No focal cerebellar signs   Psychiatric:        Behavior: Behavior normal.        Thought Content: Thought content normal.           Assessment & Plan:   Problem List Items Addressed This Visit       Other   Occipital pain - Primary   Left occipital pressure and pain  Very Welden tenderness on exam and some mild neck pain with right sided head tilt  Normal neuro exam Discussed possible headache syndrome, neck muscle pain or occipital neuralgia   Lab today for inflammation  Also cbc  Instructed to  Use ice  Sleep in neck neutral position Prednsione prescription 40 mg taper  Tizanidine for neck muscle spasm with caution of sedation   Update if not starting to improve in a week or if worsening  Call back and Er precautions noted in detail today        Relevant Medications   tiZANidine (ZANAFLEX) 4 MG tablet   Other Relevant Orders   CBC with Differential/Platelet   C-reactive  protein   Sedimentation Rate   Elevated platelet count   Cbc today   Has had some posterior left sided headache      Relevant Orders   CBC with Differential/Platelet

## 2023-06-18 ENCOUNTER — Ambulatory Visit: Payer: Self-pay | Admitting: Family Medicine

## 2023-07-31 ENCOUNTER — Other Ambulatory Visit: Payer: Self-pay | Admitting: Family Medicine

## 2023-10-17 ENCOUNTER — Other Ambulatory Visit: Payer: Self-pay | Admitting: Obstetrics and Gynecology

## 2023-10-17 DIAGNOSIS — Z1231 Encounter for screening mammogram for malignant neoplasm of breast: Secondary | ICD-10-CM

## 2023-10-24 ENCOUNTER — Other Ambulatory Visit: Payer: Self-pay | Admitting: Family Medicine

## 2023-11-01 ENCOUNTER — Ambulatory Visit (INDEPENDENT_AMBULATORY_CARE_PROVIDER_SITE_OTHER): Admitting: Family Medicine

## 2023-11-01 ENCOUNTER — Encounter: Payer: Self-pay | Admitting: Family Medicine

## 2023-11-01 VITALS — BP 124/68 | HR 68 | Temp 98.2°F | Ht 66.5 in | Wt 192.1 lb

## 2023-11-01 DIAGNOSIS — Z23 Encounter for immunization: Secondary | ICD-10-CM

## 2023-11-01 DIAGNOSIS — K219 Gastro-esophageal reflux disease without esophagitis: Secondary | ICD-10-CM

## 2023-11-01 DIAGNOSIS — Z13220 Encounter for screening for lipoid disorders: Secondary | ICD-10-CM | POA: Diagnosis not present

## 2023-11-01 DIAGNOSIS — E7800 Pure hypercholesterolemia, unspecified: Secondary | ICD-10-CM

## 2023-11-01 DIAGNOSIS — Z0000 Encounter for general adult medical examination without abnormal findings: Secondary | ICD-10-CM | POA: Diagnosis not present

## 2023-11-01 DIAGNOSIS — Z79899 Other long term (current) drug therapy: Secondary | ICD-10-CM

## 2023-11-01 DIAGNOSIS — Z1159 Encounter for screening for other viral diseases: Secondary | ICD-10-CM | POA: Insufficient documentation

## 2023-11-01 DIAGNOSIS — Z114 Encounter for screening for human immunodeficiency virus [HIV]: Secondary | ICD-10-CM | POA: Insufficient documentation

## 2023-11-01 NOTE — Assessment & Plan Note (Signed)
 B12 level added to lab

## 2023-11-01 NOTE — Assessment & Plan Note (Signed)
 Disc goals for lipids and reasons to control them Rev last labs with pt Rev low sat fat diet in detail  Lab today  Interested in cardiac ca score-ordered

## 2023-11-01 NOTE — Assessment & Plan Note (Signed)
 Reviewed health habits including diet and exercise and skin cancer prevention Reviewed appropriate screening tests for age  Also reviewed health mt list, fam hx and immunization status , as well as social and family history   See HPI Labs reviewed and ordered Health Maintenance  Topic Date Due   HIV Screening  Never done   Hepatitis C Screening  Never done   Hepatitis B Vaccine (1 of 3 - 19+ 3-dose series) Never done   Pap with HPV screening  01/20/2019   Pneumococcal Vaccine for age over 22 (1 of 1 - PCV) Never done   Breast Cancer Screening  12/11/2022   COVID-19 Vaccine (6 - 2025-26 season) 11/16/2025*   Colon Cancer Screening  07/16/2029   DTaP/Tdap/Td vaccine (3 - Td or Tdap) 05/20/2030   Flu Shot  Completed   Zoster (Shingles) Vaccine  Completed   HPV Vaccine  Aged Out   Meningitis B Vaccine  Aged Out  *Topic was postponed. The date shown is not the original due date.    Hep C /HIV today  Encouraged to schedule mammo  Utd gyn care Discussed fall prevention, supplements and exercise for bone density  Encouraged to add vit D  PHQ 6 from fatigue / pt thinks mood is ok

## 2023-11-01 NOTE — Progress Notes (Signed)
 Subjective:    Patient ID: Joann Davis, female    DOB: 1970/05/02, 53 y.o.   MRN: [id number]  HPI  Here for health maintenance exam and to review chronic medical problems   Wt Readings from Last 3 Encounters:  11/01/23 192 lb 2 oz (87.1 kg)  06/16/23 192 lb (87.1 kg)  09/27/22 191 lb (86.6 kg)   30.55 kg/m  Vitals:   11/01/23 1128  BP: 124/68  Pulse: 68  Temp: 98.2 F (36.8 C)  SpO2: 97%    Immunization History  Administered Date(s) Administered   Influenza Split 12/02/2011   Influenza, Quadrivalent, Recombinant, Inj, Pf 11/07/2018   Influenza, Seasonal, Injecte, Preservative Fre 09/27/2022, 11/01/2023   Influenza,inj,Quad PF,6+ Mos 04/01/2015, 01/01/2016, 10/05/2016, 11/05/2017, 12/17/2020, 01/07/2022   Influenza-Unspecified 10/10/2012, 09/11/2022   Moderna Sars-Covid-2 Vaccination 03/08/2019, 04/05/2019, 12/27/2019   Tdap 12/02/2011, 05/19/2020   Unspecified SARS-COV-2 Vaccination 03/01/2018, 10/15/2022   Zoster Recombinant(Shingrix) 04/19/2022, 11/03/2022    Health Maintenance Due  Topic Date Due   HIV Screening  Never done   Hepatitis C Screening  Never done   Hepatitis B Vaccines 19-59 Average Risk (1 of 3 - 19+ 3-dose series) Never done   Cervical Cancer Screening (HPV/Pap Cotest)  01/20/2019   Pneumococcal Vaccine: 50+ Years (1 of 1 - PCV) Never done   Mammogram  12/11/2022   Flu shot -today  Hep C /hiv screen   Mammogram ordered/ pt needs to schedule at gyn / due in dec  Self breast exam-no lumps   Gyn health Sees gyn    Colon cancer screening  Colonoscopy 07/2019 10 y recall    Bone health   Falls-none  [place]  Supplements - mvi and B12   Exercise  Walks at lunch  Up and down stairs 5 times in addn to that  Continues to add more  Walks dog at home    Hopes to retire in 4 years    Mood    11/01/2023   12:04 PM 06/16/2023   12:07 PM 09/27/2022    2:33 PM 01/12/2022    3:54 PM 12/17/2020   10:53 AM  Depression screen  PHQ 2/9  Decreased Interest 0 0 0 0 0  Down, Depressed, Hopeless 0 0 0 0 0  PHQ - 2 Score 0 0 0 0 0  Altered sleeping 0 1 0 0 1  Tired, decreased energy 3 3 3 2 1   Change in appetite 1 0 0 0 0  Feeling bad or failure about yourself  0 0 0 0 0  Trouble concentrating 2 1 1 1 1   Moving slowly or fidgety/restless 0 0 0 0 0  Suicidal thoughts 0 0 0 0 0  PHQ-9 Score 6 5 4 3 3   Difficult doing work/chores Somewhat difficult Somewhat difficult Somewhat difficult     Is tired due to schedule  Otherwise good mood Denies sleep apnea    Protonix  40 mg daily for GERD Works well overall  Some days can flare if she eats wrong or eats late  Lab Results  Component Value Date   VITAMINB12 279 01/07/2022    Lab Results  Component Value Date   WBC 6.3 06/16/2023   HGB 13.4 06/16/2023   HCT 40.1 06/16/2023   MCV 86.2 06/16/2023   PLT 391.0 06/16/2023    Hyperlipidemia Lab Results  Component Value Date   CHOL 193 01/07/2022   HDL 38.50 (L) 01/07/2022   LDLCALC 133 (H) 01/07/2022   TRIG 106.0 01/07/2022  CHOLHDL 5 01/07/2022   The 10-year ASCVD risk score (Arnett DK, et al., 2019) is: 2.7%   Values used to calculate the score:     Age: 67 years     Clincally relevant sex: Female     Is Non-Hispanic African American: No     Diabetic: No     Tobacco smoker: No     Systolic Blood Pressure: 124 mmHg     Is BP treated: No     HDL Cholesterol: 30 mg/dL     Total Cholesterol: 189 mg/dL   Fair diet  Not optimal      Patient Active Problem List   Diagnosis Date Noted   Encounter for hepatitis C screening test for low risk patient 11/01/2023   Encounter for screening for HIV 11/01/2023   Occipital pain 06/16/2023   Current use of proton pump inhibitor 12/17/2020   Hyperlipidemia 08/14/2019   Routine general medical examination at a health care facility 11/20/2011   Migraine with aura 02/11/2009   GERD 10/24/2006   Past Medical History:  Diagnosis Date   Elevated blood  pressure reading without diagnosis of hypertension    GERD (gastroesophageal reflux disease)    Migraine with aura, without mention of intractable migraine without mention of status migrainosus    Pre-eclampsia    Temporomandibular joint disorders, unspecified    History reviewed. No pertinent surgical history. Social History   Tobacco Use   Smoking status: Never   Smokeless tobacco: Never  Vaping Use   Vaping status: Never Used  Substance Use Topics   Alcohol use: Yes    Alcohol/week: 0.0 standard drinks of alcohol    Comment: Occasionally (light)   Drug use: No   Family History  Problem Relation Age of Onset   Hypertension Mother    Colonic polyp Mother    Stroke Father 82       x 2   Diabetes type II Father    Breast cancer Maternal Grandmother    Stroke Paternal Grandmother 59   Diabetes Paternal Grandfather    Colon cancer Maternal Aunt    Colon cancer Maternal Uncle    Allergies  Allergen Reactions   Monistat [Miconazole]    Other     Other reaction(s): Unknown   Tramadol     Nausea and vomiting   Current Outpatient Medications on File Prior to Visit  Medication Sig Dispense Refill   cyanocobalamin  (VITAMIN B12) 1000 MCG tablet Take 1,000 mcg by mouth daily.     ibuprofen (ADVIL,MOTRIN) 200 MG tablet Take 200 mg by mouth as needed.     Multiple Vitamin (MULTIVITAMIN) tablet Take 1 tablet by mouth daily.     pantoprazole  (PROTONIX ) 40 MG tablet TAKE 1 TABLET BY MOUTH DAILY 90 tablet 3   No current facility-administered medications on file prior to visit.    Review of Systems  Constitutional:  Negative for activity change, appetite change, fatigue, fever and unexpected weight change.  HENT:  Negative for congestion, ear pain, rhinorrhea, sinus pressure and sore throat.   Eyes:  Negative for pain, redness and visual disturbance.  Respiratory:  Negative for cough, shortness of breath and wheezing.   Cardiovascular:  Negative for chest pain and palpitations.   Gastrointestinal:  Negative for abdominal pain, blood in stool, constipation and diarrhea.       Occational heartburn   Endocrine: Negative for polydipsia and polyuria.  Genitourinary:  Negative for dysuria, frequency and urgency.  Musculoskeletal:  Negative for arthralgias, back pain  and myalgias.  Skin:  Negative for pallor and rash.  Allergic/Immunologic: Negative for environmental allergies.  Neurological:  Negative for dizziness, syncope and headaches.  Hematological:  Negative for adenopathy. Does not bruise/bleed easily.  Psychiatric/Behavioral:  Negative for decreased concentration and dysphoric mood. The patient is not nervous/anxious.        Objective:   Physical Exam Constitutional:      General: She is not in acute distress.    Appearance: Normal appearance. She is well-developed. She is obese. She is not ill-appearing or diaphoretic.  HENT:     Head: Normocephalic and atraumatic.     Right Ear: Tympanic membrane, ear canal and external ear normal.     Left Ear: Tympanic membrane, ear canal and external ear normal.     Nose: Nose normal. No congestion.     Mouth/Throat:     Mouth: Mucous membranes are moist.     Pharynx: Oropharynx is clear. No posterior oropharyngeal erythema.  Eyes:     General: No scleral icterus.    Extraocular Movements: Extraocular movements intact.     Conjunctiva/sclera: Conjunctivae normal.     Pupils: Pupils are equal, round, and reactive to light.  Neck:     Thyroid : No thyromegaly.     Vascular: No carotid bruit or JVD.  Cardiovascular:     Rate and Rhythm: Normal rate and regular rhythm.     Pulses: Normal pulses.     Heart sounds: Normal heart sounds.     No gallop.  Pulmonary:     Effort: Pulmonary effort is normal. No respiratory distress.     Breath sounds: Normal breath sounds. No wheezing.     Comments: Good air exch Chest:     Chest wall: No tenderness.  Abdominal:     General: Bowel sounds are normal. There is no  distension or abdominal bruit.     Palpations: Abdomen is soft. There is no mass.     Tenderness: There is no abdominal tenderness.     Hernia: No hernia is present.  Genitourinary:    Comments: Breast and pelvic exam are done by gyn provider   Musculoskeletal:        General: No tenderness. Normal range of motion.     Cervical back: Normal range of motion and neck supple. No rigidity. No muscular tenderness.     Right lower leg: No edema.     Left lower leg: No edema.     Comments: No kyphosis   Lymphadenopathy:     Cervical: No cervical adenopathy.  Skin:    General: Skin is warm and dry.     Coloration: Skin is not pale.     Findings: No erythema or rash.     Comments: Solar lentigines diffusely   Neurological:     Mental Status: She is alert. Mental status is at baseline.     Cranial Nerves: No cranial nerve deficit.     Motor: No abnormal muscle tone.     Coordination: Coordination normal.     Gait: Gait normal.     Deep Tendon Reflexes: Reflexes are normal and symmetric. Reflexes normal.  Psychiatric:        Mood and Affect: Mood normal.        Cognition and Memory: Cognition and memory normal.           Assessment & Plan:   Problem List Items Addressed This Visit       Digestive   GERD   Protonix   40 mg daily helps if she watches diet  Encouraged to avoid triggers         Other   Routine general medical examination at a health care facility - Primary   Reviewed health habits including diet and exercise and skin cancer prevention Reviewed appropriate screening tests for age  Also reviewed health mt list, fam hx and immunization status , as well as social and family history   See HPI Labs reviewed and ordered Health Maintenance  Topic Date Due   HIV Screening  Never done   Hepatitis C Screening  Never done   Hepatitis B Vaccine (1 of 3 - 19+ 3-dose series) Never done   Pap with HPV screening  01/20/2019   Pneumococcal Vaccine for age over 26 (1 of 1  - PCV) Never done   Breast Cancer Screening  12/11/2022   COVID-19 Vaccine (6 - 2025-26 season) 11/16/2025*   Colon Cancer Screening  07/16/2029   DTaP/Tdap/Td vaccine (3 - Td or Tdap) 05/20/2030   Flu Shot  Completed   Zoster (Shingles) Vaccine  Completed   HPV Vaccine  Aged Out   Meningitis B Vaccine  Aged Out  *Topic was postponed. The date shown is not the original due date.    Hep C /HIV today  Encouraged to schedule mammo  Utd gyn care Discussed fall prevention, supplements and exercise for bone density  Encouraged to add vit D  PHQ 6 from fatigue / pt thinks mood is ok       Relevant Orders   TSH   Lipid Panel   Comprehensive metabolic panel with GFR   CBC with Differential/Platelet   Hyperlipidemia   Disc goals for lipids and reasons to control them Rev last labs with pt Rev low sat fat diet in detail  Lab today  Interested in cardiac ca score-ordered       Relevant Orders   CT CARDIAC SCORING (SELF PAY ONLY)   Encounter for screening for HIV   HIV screen today  Low risk      Relevant Orders   HIV Antibody (routine testing w rflx)   Encounter for hepatitis C screening test for low risk patient   Hep C screen today  Low risk      Relevant Orders   Hepatitis C Antibody   Current use of proton pump inhibitor   B12 level added to lab      Relevant Orders   Vitamin B12   Other Visit Diagnoses       Need for influenza vaccination       Relevant Orders   Flu vaccine trivalent PF, 6mos and older(Flulaval,Afluria,Fluarix,Fluzone) (Completed)

## 2023-11-01 NOTE — Assessment & Plan Note (Signed)
Hep C screen today Low risk 

## 2023-11-01 NOTE — Patient Instructions (Addendum)
 Get your mammogram scheduled   Continue your multi vitamin  Make sure you get at least 2000 international units of D3 daily    Keep walking /stairs  Add some strength training to your routine, this is important for bone and brain health and can reduce your risk of falls and help your body use insulin properly and regulate weight  Light weights, exercise bands , and internet videos are a good way to start  Yoga (chair or regular), machines , floor exercises or a gym with machines are also good options   Lab today  Flu shot   I put the referral in for a cardiac calcium score test  Please let us  know if you don't hear in 1-2 weeks to set that up (mychart message or call or letter)

## 2023-11-01 NOTE — Assessment & Plan Note (Signed)
 HIV screen today Low risk

## 2023-11-01 NOTE — Assessment & Plan Note (Signed)
 Protonix  40 mg daily helps if she watches diet  Encouraged to avoid triggers

## 2023-11-02 ENCOUNTER — Ambulatory Visit: Payer: Self-pay | Admitting: Family Medicine

## 2023-11-02 LAB — HIV ANTIBODY (ROUTINE TESTING W REFLEX)
HIV 1&2 Ab, 4th Generation: NONREACTIVE
HIV FINAL INTERPRETATION: NEGATIVE

## 2023-11-02 LAB — COMPREHENSIVE METABOLIC PANEL WITH GFR
ALT: 16 U/L (ref 0–35)
AST: 15 U/L (ref 0–37)
Albumin: 4.2 g/dL (ref 3.5–5.2)
Alkaline Phosphatase: 62 U/L (ref 39–117)
BUN: 10 mg/dL (ref 6–23)
CO2: 28 meq/L (ref 19–32)
Calcium: 9.1 mg/dL (ref 8.4–10.5)
Chloride: 101 meq/L (ref 96–112)
Creatinine, Ser: 0.58 mg/dL (ref 0.40–1.20)
GFR: 102.98 mL/min (ref >60.00)
Glucose, Bld: 118 mg/dL — ABNORMAL HIGH (ref 70–99)
Potassium: 4 meq/L (ref 3.5–5.1)
Sodium: 139 meq/L (ref 135–145)
Total Bilirubin: 0.3 mg/dL (ref 0.2–1.2)
Total Protein: 6.4 g/dL (ref 6.0–8.3)

## 2023-11-02 LAB — LIPID PANEL
Cholesterol: 205 mg/dL — ABNORMAL HIGH (ref 0–200)
HDL: 39.2 mg/dL (ref >39.00)
LDL Cholesterol: 140 mg/dL — ABNORMAL HIGH (ref 0–99)
NonHDL: 165.44
Total CHOL/HDL Ratio: 5
Triglycerides: 128 mg/dL (ref 0.0–149.0)
VLDL: 25.6 mg/dL (ref 0.0–40.0)

## 2023-11-02 LAB — HEPATITIS C ANTIBODY: Hepatitis C Ab: NONREACTIVE

## 2023-11-02 LAB — CBC WITH DIFFERENTIAL/PLATELET
Basophils Absolute: 0 K/uL (ref 0.0–0.1)
Basophils Relative: 0.7 % (ref 0.0–3.0)
Eosinophils Absolute: 0.1 K/uL (ref 0.0–0.7)
Eosinophils Relative: 0.9 % (ref 0.0–5.0)
HCT: 39.7 % (ref 36.0–46.0)
Hemoglobin: 12.9 g/dL (ref 12.0–15.0)
Lymphocytes Relative: 25.1 % (ref 12.0–46.0)
Lymphs Abs: 1.6 K/uL (ref 0.7–4.0)
MCHC: 32.5 g/dL (ref 30.0–36.0)
MCV: 87.3 fl (ref 78.0–100.0)
Monocytes Absolute: 0.5 K/uL (ref 0.1–1.0)
Monocytes Relative: 7.2 % (ref 3.0–12.0)
Neutro Abs: 4.1 K/uL (ref 1.4–7.7)
Neutrophils Relative %: 66.1 % (ref 43.0–77.0)
Platelets: 487 K/uL — ABNORMAL HIGH (ref 150.0–400.0)
RBC: 4.54 Mil/uL (ref 3.87–5.11)
RDW: 13.7 % (ref 11.5–15.5)
WBC: 6.2 K/uL (ref 4.0–10.5)

## 2023-11-02 LAB — VITAMIN B12: Vitamin B-12: >1500 pg/mL — ABNORMAL HIGH (ref 211–911)

## 2023-11-02 LAB — TSH: TSH: 0.86 u[IU]/mL (ref 0.35–5.50)

## 2023-12-22 ENCOUNTER — Ambulatory Visit
Admission: RE | Admit: 2023-12-22 | Discharge: 2023-12-22 | Disposition: A | Source: Ambulatory Visit | Attending: Obstetrics and Gynecology | Admitting: Obstetrics and Gynecology

## 2023-12-22 DIAGNOSIS — Z1231 Encounter for screening mammogram for malignant neoplasm of breast: Secondary | ICD-10-CM
